# Patient Record
Sex: Male | Born: 1935 | Race: White | Hispanic: No | Marital: Married
Health system: Southern US, Community
[De-identification: ages and names within clinical notes are randomized; demographics above are authoritative.]

---

## 2018-06-05 ENCOUNTER — Emergency Department
Admission: EM | Admit: 2018-06-05 | Discharge: 2018-06-05 | Disposition: A | Discharge status: Skilled Nursing Facility | Payer: Medicare HMO | Attending: Emergency Medicine | Admitting: Emergency Medicine

## 2018-06-05 ENCOUNTER — Emergency Department: Payer: Medicare HMO

## 2018-06-05 ENCOUNTER — Other Ambulatory Visit: Payer: Self-pay

## 2018-06-05 DIAGNOSIS — Z23 Encounter for immunization: Secondary | ICD-10-CM | POA: Diagnosis not present

## 2018-06-05 DIAGNOSIS — Y92128 Other place in nursing home as the place of occurrence of the external cause: Secondary | ICD-10-CM | POA: Insufficient documentation

## 2018-06-05 DIAGNOSIS — W19XXXA Unspecified fall, initial encounter: Secondary | ICD-10-CM | POA: Diagnosis not present

## 2018-06-05 DIAGNOSIS — Y9389 Activity, other specified: Secondary | ICD-10-CM | POA: Insufficient documentation

## 2018-06-05 DIAGNOSIS — S0990XA Unspecified injury of head, initial encounter: Secondary | ICD-10-CM | POA: Diagnosis present

## 2018-06-05 DIAGNOSIS — S0001XA Abrasion of scalp, initial encounter: Secondary | ICD-10-CM | POA: Insufficient documentation

## 2018-06-05 DIAGNOSIS — T148XXA Other injury of unspecified body region, initial encounter: Secondary | ICD-10-CM

## 2018-06-05 DIAGNOSIS — Y999 Unspecified external cause status: Secondary | ICD-10-CM | POA: Insufficient documentation

## 2018-06-05 DIAGNOSIS — F0390 Unspecified dementia without behavioral disturbance: Secondary | ICD-10-CM | POA: Diagnosis not present

## 2018-06-05 LAB — URINALYSIS, COMPLETE (UACMP) WITH MICROSCOPIC
BILIRUBIN URINE: NEGATIVE
GLUCOSE, UA: NEGATIVE mg/dL
Ketones, ur: NEGATIVE mg/dL
LEUKOCYTES UA: NEGATIVE
NITRITE: NEGATIVE
PH: 6 (ref 5.0–8.0)
Protein, ur: NEGATIVE mg/dL
RBC / HPF: >50 RBC/hpf — ABNORMAL HIGH (ref 0–5)
SPECIFIC GRAVITY, URINE: 1.009 (ref 1.005–1.030)

## 2018-06-05 LAB — CBC WITH DIFFERENTIAL/PLATELET
BASOS ABS: 0 10*3/uL (ref 0–0.1)
BASOS PCT: 0 %
EOS ABS: 0 10*3/uL (ref 0–0.7)
EOS PCT: 0 %
HCT: 30.8 % — ABNORMAL LOW (ref 40.0–52.0)
HEMOGLOBIN: 10.5 g/dL — AB (ref 13.0–18.0)
LYMPHS ABS: 0.6 10*3/uL — AB (ref 1.0–3.6)
Lymphocytes Relative: 6 %
MCH: 32.4 pg (ref 26.0–34.0)
MCHC: 34.1 g/dL (ref 32.0–36.0)
MCV: 94.9 fL (ref 80.0–100.0)
Monocytes Absolute: 0.8 10*3/uL (ref 0.2–1.0)
Monocytes Relative: 9 %
NEUTROS PCT: 85 %
Neutro Abs: 7.6 10*3/uL — ABNORMAL HIGH (ref 1.4–6.5)
PLATELETS: 319 10*3/uL (ref 150–440)
RBC: 3.24 MIL/uL — AB (ref 4.40–5.90)
RDW: 13.2 % (ref 11.5–14.5)
WBC: 9 10*3/uL (ref 3.8–10.6)

## 2018-06-05 LAB — COMPREHENSIVE METABOLIC PANEL
ALT: 22 U/L (ref 0–44)
AST: 42 U/L — ABNORMAL HIGH (ref 15–41)
Albumin: 3 g/dL — ABNORMAL LOW (ref 3.5–5.0)
Alkaline Phosphatase: 71 U/L (ref 38–126)
Anion gap: 8 (ref 5–15)
BILIRUBIN TOTAL: 0.6 mg/dL (ref 0.3–1.2)
BUN: 19 mg/dL (ref 8–23)
CO2: 20 mmol/L — ABNORMAL LOW (ref 22–32)
Calcium: 8.3 mg/dL — ABNORMAL LOW (ref 8.9–10.3)
Chloride: 112 mmol/L — ABNORMAL HIGH (ref 98–111)
Creatinine, Ser: 1.11 mg/dL (ref 0.61–1.24)
GFR, EST NON AFRICAN AMERICAN: 60 mL/min — AB (ref >60)
Glucose, Bld: 102 mg/dL — ABNORMAL HIGH (ref 70–99)
Potassium: 3.8 mmol/L (ref 3.5–5.1)
Sodium: 140 mmol/L (ref 135–145)
TOTAL PROTEIN: 5.7 g/dL — AB (ref 6.5–8.1)

## 2018-06-05 LAB — GLUCOSE, CAPILLARY: GLUCOSE-CAPILLARY: 103 mg/dL — AB (ref 70–99)

## 2018-06-05 LAB — TROPONIN I: TROPONIN I: 0.03 ng/mL — AB (ref <0.03)

## 2018-06-05 MED ORDER — TETANUS-DIPHTH-ACELL PERTUSSIS 5-2.5-18.5 LF-MCG/0.5 IM SUSP
0.5000 mL | Freq: Once | INTRAMUSCULAR | Status: AC
  Administered 2018-06-05: 0.5 mL via INTRAMUSCULAR
  Filled 2018-06-05: qty 0.5

## 2018-06-05 NOTE — ED Notes (Addendum)
Pt is dressed and ready to go back to facility. Representative en route to pick patient up. Report already given to facility. See prior notes.  Pt unable to sign for discharge.

## 2018-06-05 NOTE — Discharge Instructions (Signed)
It was a pleasure to take care of you today, and thank you for coming to our emergency department.  If you have any questions or concerns before leaving please ask the nurse to grab me and I'm more than happy to go through your aftercare instructions again.  If you were prescribed any opioid pain medication today such as Norco, Vicodin, Percocet, morphine, hydrocodone, or oxycodone please make sure you do not drive when you are taking this medication as it can alter your ability to drive safely.  If you have any concerns once you are home that you are not improving or are in fact getting worse before you can make it to your follow-up appointment, please do not hesitate to call 911 and come back for further evaluation.  Merrily BrittleNeil Sherice Ijames, MD  Results for orders placed or performed during the hospital encounter of 06/05/18  Comprehensive metabolic panel  Result Value Ref Range   Sodium 140 135 - 145 mmol/L   Potassium 3.8 3.5 - 5.1 mmol/L   Chloride 112 (H) 98 - 111 mmol/L   CO2 20 (L) 22 - 32 mmol/L   Glucose, Bld 102 (H) 70 - 99 mg/dL   BUN 19 8 - 23 mg/dL   Creatinine, Ser 1.611.11 0.61 - 1.24 mg/dL   Calcium 8.3 (L) 8.9 - 10.3 mg/dL   Total Protein 5.7 (L) 6.5 - 8.1 g/dL   Albumin 3.0 (L) 3.5 - 5.0 g/dL   AST 42 (H) 15 - 41 U/L   ALT 22 0 - 44 U/L   Alkaline Phosphatase 71 38 - 126 U/L   Total Bilirubin 0.6 0.3 - 1.2 mg/dL   GFR calc non Af Amer 60 (L) >60 mL/min   GFR calc Af Amer >60 >60 mL/min   Anion gap 8 5 - 15  CBC with Differential  Result Value Ref Range   WBC 9.0 3.8 - 10.6 K/uL   RBC 3.24 (L) 4.40 - 5.90 MIL/uL   Hemoglobin 10.5 (L) 13.0 - 18.0 g/dL   HCT 09.630.8 (L) 04.540.0 - 40.952.0 %   MCV 94.9 80.0 - 100.0 fL   MCH 32.4 26.0 - 34.0 pg   MCHC 34.1 32.0 - 36.0 g/dL   RDW 81.113.2 91.411.5 - 78.214.5 %   Platelets 319 150 - 440 K/uL   Neutrophils Relative % 85 %   Neutro Abs 7.6 (H) 1.4 - 6.5 K/uL   Lymphocytes Relative 6 %   Lymphs Abs 0.6 (L) 1.0 - 3.6 K/uL   Monocytes Relative 9 %   Monocytes Absolute 0.8 0.2 - 1.0 K/uL   Eosinophils Relative 0 %   Eosinophils Absolute 0.0 0 - 0.7 K/uL   Basophils Relative 0 %   Basophils Absolute 0.0 0 - 0.1 K/uL  Troponin I  Result Value Ref Range   Troponin I 0.03 (HH) <0.03 ng/mL  Glucose, capillary  Result Value Ref Range   Glucose-Capillary 103 (H) 70 - 99 mg/dL   Dg Wrist Complete Left  Result Date: 06/05/2018 CLINICAL DATA:  Fall onto outstretched hand. Skin tear at the left wrist. Initial encounter. EXAM: LEFT WRIST - COMPLETE 3+ VIEW COMPARISON:  None. FINDINGS: There is no evidence of fracture or dislocation.  Osteopenia. IMPRESSION: No acute finding. Electronically Signed   By: Marnee SpringJonathon  Watts M.D.   On: 06/05/2018 08:44   Ct Head Wo Contrast  Result Date: 06/05/2018 CLINICAL DATA:  Pain following fall EXAM: CT HEAD WITHOUT CONTRAST CT CERVICAL SPINE WITHOUT CONTRAST TECHNIQUE: Multidetector CT imaging of  the head and cervical spine was performed following the standard protocol without intravenous contrast. Multiplanar CT image reconstructions of the cervical spine were also generated. COMPARISON:  Head CT Mar 25, 2018 FINDINGS: CT HEAD FINDINGS Brain: There is moderate diffuse atrophy. There is no intracranial mass, acute hemorrhage, extra-axial fluid collection, or midline shift. There is a prior infarct in the right frontal lobe with resolution of acute hemorrhage compared to prior study. Prior infarct in the right parietal lobe with resolving hemorrhage in this area. Elsewhere there is patchy small vessel disease in the centra semiovale bilaterally. Vascular: There is no appreciable hyperdense vessel. There is calcification in each distal vertebral artery and in the carotid siphon regions bilaterally. Skull: Bony calvarium appears intact. Sinuses/Orbits: There is mucosal thickening in multiple ethmoid air cells bilaterally. There is mucosal thickening in the posterior right maxillary antrum. There is mild mucosal thickening with  probable small retention cysts in each sphenoid sinus. There is mild mucosal thickening in the posterior inferior right frontal region. Orbits appear symmetric bilaterally. Patient has had cataract removals bilaterally. Other: Mastoid air cells are clear. CT CERVICAL SPINE FINDINGS Alignment: There is 2 mm of retrolisthesis of C3 on C4. No other spondylolisthesis evident. Skull base and vertebrae: Skull base and craniocervical junction regions appear normal. No fracture is evident. No blastic or lytic bone lesions are appreciable. Soft tissues and spinal canal: The prevertebral soft tissues and predental space regions are normal. There is no evident paraspinous lesion. There is no cord or canal hematoma evident. Disc levels: There is a moderate disc space narrowing at C5-6 and C6-7. There is multilevel facet osteoarthritic change. There is no frank disc extrusion or stenosis. Upper chest: There are scattered areas of bullae and scarring in the lung apices. An azygos lobe is noted on the right, an anatomic variant. Other: There is calcification in both carotid and vertebral arteries. IMPRESSION: CT head: Atrophy with periventricular small vessel disease. 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 No acute hemorrhage evident. No new intracranial lesion. Multiple foci of arterial vascular calcification. Multifocal paranasal sinus disease evident. CT cervical spine: No appreciable fracture. Slight spondylolisthesis at C3-4 is felt to be due to underlying spondylosis. Multilevel osteoarthritic change noted. Small bullae in the apices noted. Foci of carotid and vertebral artery calcification present bilaterally. Electronically Signed   By: Bretta Bang III M.D.   On: 06/05/2018 08:55   Ct Cervical Spine Wo Contrast  Result Date: 06/05/2018 CLINICAL DATA:  Pain following fall EXAM: CT HEAD WITHOUT CONTRAST CT CERVICAL SPINE WITHOUT CONTRAST TECHNIQUE: Multidetector  CT imaging of the head and cervical spine was performed following the standard protocol without intravenous contrast. Multiplanar CT image reconstructions of the cervical spine were also generated. COMPARISON:  Head CT Mar 25, 2018 FINDINGS: CT HEAD FINDINGS Brain: There is moderate diffuse atrophy. There is no intracranial mass, acute hemorrhage, extra-axial fluid collection, or midline shift. There is a prior infarct in the right frontal lobe with resolution of acute hemorrhage compared to prior study. Prior infarct in the right parietal lobe with resolving hemorrhage in this area. Elsewhere there is patchy small vessel disease in the centra semiovale bilaterally. Vascular: There is no appreciable hyperdense vessel. There is calcification in each distal vertebral artery and in the carotid siphon regions bilaterally. Skull: Bony calvarium appears intact. Sinuses/Orbits: There is mucosal thickening in multiple ethmoid air cells bilaterally. There is mucosal thickening in the posterior right maxillary antrum. There is mild  mucosal thickening with probable small retention cysts in each sphenoid sinus. There is mild mucosal thickening in the posterior inferior right frontal region. Orbits appear symmetric bilaterally. Patient has had cataract removals bilaterally. Other: Mastoid air cells are clear. CT CERVICAL SPINE FINDINGS Alignment: There is 2 mm of retrolisthesis of C3 on C4. No other spondylolisthesis evident. Skull base and vertebrae: Skull base and craniocervical junction regions appear normal. No fracture is evident. No blastic or lytic bone lesions are appreciable. Soft tissues and spinal canal: The prevertebral soft tissues and predental space regions are normal. There is no evident paraspinous lesion. There is no cord or canal hematoma evident. Disc levels: There is a moderate disc space narrowing at C5-6 and C6-7. There is multilevel facet osteoarthritic change. There is no frank disc extrusion or  stenosis. Upper chest: There are scattered areas of bullae and scarring in the lung apices. An azygos lobe is noted on the right, an anatomic variant. Other: There is calcification in both carotid and vertebral arteries. IMPRESSION: CT head: Atrophy with periventricular small vessel disease. 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 No acute hemorrhage evident. No new intracranial lesion. Multiple foci of arterial vascular calcification. Multifocal paranasal sinus disease evident. CT cervical spine: No appreciable fracture. Slight spondylolisthesis at C3-4 is felt to be due to underlying spondylosis. Multilevel osteoarthritic change noted. Small bullae in the apices noted. Foci of carotid and vertebral artery calcification present bilaterally. Electronically Signed   By: Bretta Bang III M.D.   On: 06/05/2018 08:55   Dg Chest Port 1 View  Result Date: 06/05/2018 CLINICAL DATA:  Syncope. EXAM: PORTABLE CHEST 1 VIEW COMPARISON:  None. FINDINGS: The heart size and mediastinal contours are within normal limits. Normal pulmonary vascularity. Biapical pleuroparenchymal scarring, greater on left. No focal consolidation, pleural effusion, or pneumothorax. No acute osseous abnormality. IMPRESSION: No active disease. Electronically Signed   By: Obie Dredge M.D.   On: 06/05/2018 08:47

## 2018-06-05 NOTE — ED Notes (Signed)
No urine noted in condom catheter/leg bag. Will continue to check. Pt resting comfortably at this time in NAD.

## 2018-06-05 NOTE — ED Notes (Signed)
Transport from facility is coming to pick patient up.

## 2018-06-05 NOTE — ED Triage Notes (Signed)
Pt arrives from [facility] dementia unit s/p unwitnessed fall from a chair. Staff was helping pt dress and stepped out of the room when patient fell out of chair. Skin tear to forehead and left wrist.

## 2018-06-05 NOTE — ED Notes (Signed)
Unsuccessful in & out catheterization. This RN attempted with Karle BarrKailey W, RN present. Condom cath applied to patient for urine specimen collection. MD aware.

## 2018-06-05 NOTE — ED Provider Notes (Signed)
[facility] Emergency Department Provider Note  ____________________________________________   First MD Initiated Contact with Patient 06/05/18 0710     (approximate)  I have reviewed the triage vital signs and the nursing notes.   HISTORY  Chief Complaint Fall  Level 5 exemption history limited by the patient's dementia  HPI Blake Robinson is a 84 y.o. male who comes to the emergency department via EMS after an unwitnessed fall at his memory care unit.  The patient has a past medical history of severe dementia and is unable to provide any history.  According to EMS the patient was being assisted getting up this morning and prepared for the day and his caretaker left the room for a moment and when he came back the patient was on the floor.  The patient himself can only say his name and does not know where he is, what year it is, or what happened.  He has no complaints.  EMS noted a skin tear to the top of his head.  They did not check a blood sugar.  Vital signs were unremarkable.  The patient himself has no complaints.    No past medical history on file.  There are no active problems to display for this patient.     Prior to Admission medications   Not on File    Allergies Patient has no allergy information on record.  No family history on file.  Social History Social History   Tobacco Use  . Smoking status: Not on file  Substance Use Topics  . Alcohol use: Not on file  . Drug use: Not on file    Review of Systems Level 5 exemption history limited by the patient's dementia ____________________________________________   PHYSICAL EXAM:  VITAL SIGNS: ED Triage Vitals  Enc Vitals Group     BP      Pulse      Resp      Temp      Temp src      SpO2      Weight      Height      Head Circumference      Peak Flow      Pain Score      Pain Loc      Pain Edu?      Excl. in GC?     Constitutional: Alert and oriented x1 to name only.   He does not know where he is what year it is who the president is or why he is here.  He is shaking although he says he is not cold.  Clearly quite confused Eyes: PERRL EOMI. midrange and brisk Head: 5 cm skin tear to vertex of his scalp. Nose: No congestion/rhinnorhea. Mouth/Throat: No trismus Neck: No stridor.  No midline tenderness or step-offs Cardiovascular: Normal rate, regular rhythm. Grossly normal heart sounds.  Good peripheral circulation. Respiratory: Normal respiratory effort.  No retractions. Lungs CTAB and moving good air Gastrointestinal: Soft nontender Musculoskeletal: No lower extremity edema.  Appears contracted and lying on his left side Abrasion to dorsal aspect of left wrist Neurologic: Moves all 4. Skin:  Skin is warm, dry and intact. No rash noted. Psychiatric: Severe dementia   ____________________________________________   DIFFERENTIAL includes but not limited to  Cardiogenic syncope, vasovagal syncope, mechanical fall, intracerebral hemorrhage, cervical spine fracture, urinary tract infection ____________________________________________   LABS (all labs ordered are listed, but only abnormal results are displayed)  Labs Reviewed  COMPREHENSIVE METABOLIC PANEL - Abnormal;  Notable for the following components:      Result Value   Chloride 112 (*)    CO2 20 (*)    Glucose, Bld 102 (*)    Calcium 8.3 (*)    Total Protein 5.7 (*)    Albumin 3.0 (*)    AST 42 (*)    GFR calc non Af Amer 60 (*)    All other components within normal limits  CBC WITH DIFFERENTIAL/PLATELET - Abnormal; Notable for the following components:   RBC 3.24 (*)    Hemoglobin 10.5 (*)    HCT 30.8 (*)    Neutro Abs 7.6 (*)    Lymphs Abs 0.6 (*)    All other components within normal limits  TROPONIN I - Abnormal; Notable for the following components:   Troponin I 0.03 (*)    All other components within normal limits  GLUCOSE, CAPILLARY - Abnormal; Notable for the following  components:   Glucose-Capillary 103 (*)    All other components within normal limits  URINALYSIS, COMPLETE (UACMP) WITH MICROSCOPIC - Abnormal; Notable for the following components:   Color, Urine YELLOW (*)    APPearance HAZY (*)    Hgb urine dipstick LARGE (*)    RBC / HPF >50 (*)    Bacteria, UA FEW (*)    All other components within normal limits    Lab work reviewed by me with hematuria although he had a traumatic Foley placement.  Otherwise unremarkable. __________________________________________  EKG  ED ECG REPORT I, Merrily Brittle, the attending physician, personally viewed and interpreted this ECG.  Date: 06/05/2018 EKG Time:  Rate: 66 Rhythm: normal sinus rhythm QRS Axis: Rightward axis Intervals: normal ST/T Wave abnormalities: normal Narrative Interpretation: no evidence of acute ischemia  ____________________________________________  RADIOLOGY  Head CT reviewed by me with no acute disease ____________________________________________   PROCEDURES  Procedure(s) performed: no  Procedures  Critical Care performed: no  ____________________________________________   INITIAL IMPRESSION / ASSESSMENT AND PLAN / ED COURSE  Pertinent labs & imaging results that were available during my care of the patient were reviewed by me and considered in my medical decision making (see chart for details).   As part of my medical decision making, I reviewed the following data within the electronic MEDICAL RECORD NUMBER History obtained from family if available, nursing notes, old chart and ekg, as well as notes from prior ED visits.  The patient comes to the emergency department profoundly demented with obvious head trauma.  Unclear if this represented a mechanical fall or syncopal event.  CT scan is fortunately reassuring and lab work with no clear etiology of the symptoms identified.  He was kept on monitor multiple hours with no ectopy.  Tetanus updated and wound washed out.   At this point the patient has no acute medical issues and he is medically stable for return to his facility with primary care follow-up.      ____________________________________________   FINAL CLINICAL IMPRESSION(S) / ED DIAGNOSES  Final diagnoses:  Avulsion of skin  Fall, initial encounter      NEW MEDICATIONS STARTED DURING THIS VISIT:  There are no discharge medications for this patient.    Note:  This document was prepared using Dragon voice recognition software and may include unintentional dictation errors.     Merrily Brittle, MD 06/06/18 1021

## 2018-06-06 ENCOUNTER — Emergency Department
Admission: EM | Admit: 2018-06-06 | Discharge: 2018-06-06 | Disposition: A | Discharge status: Home / Self Care | Payer: Medicare HMO | Attending: Emergency Medicine | Admitting: Emergency Medicine

## 2018-06-06 ENCOUNTER — Emergency Department: Payer: Medicare HMO

## 2018-06-06 ENCOUNTER — Encounter: Payer: Self-pay | Admitting: Emergency Medicine

## 2018-06-06 DIAGNOSIS — W19XXXA Unspecified fall, initial encounter: Secondary | ICD-10-CM | POA: Insufficient documentation

## 2018-06-06 DIAGNOSIS — F0390 Unspecified dementia without behavioral disturbance: Secondary | ICD-10-CM | POA: Diagnosis not present

## 2018-06-06 DIAGNOSIS — Z79899 Other long term (current) drug therapy: Secondary | ICD-10-CM | POA: Insufficient documentation

## 2018-06-06 DIAGNOSIS — R2242 Localized swelling, mass and lump, left lower limb: Secondary | ICD-10-CM | POA: Diagnosis present

## 2018-06-06 DIAGNOSIS — M7989 Other specified soft tissue disorders: Secondary | ICD-10-CM

## 2018-06-06 LAB — CBC WITH DIFFERENTIAL/PLATELET
BASOS ABS: 0 10*3/uL (ref 0–0.1)
BASOS PCT: 0 %
EOS ABS: 0 10*3/uL (ref 0–0.7)
Eosinophils Relative: 0 %
HCT: 29.7 % — ABNORMAL LOW (ref 40.0–52.0)
HEMOGLOBIN: 10.1 g/dL — AB (ref 13.0–18.0)
Lymphocytes Relative: 16 %
Lymphs Abs: 1.3 10*3/uL (ref 1.0–3.6)
MCH: 32.2 pg (ref 26.0–34.0)
MCHC: 34 g/dL (ref 32.0–36.0)
MCV: 94.8 fL (ref 80.0–100.0)
MONOS PCT: 10 %
Monocytes Absolute: 0.8 10*3/uL (ref 0.2–1.0)
NEUTROS PCT: 74 %
Neutro Abs: 6.1 10*3/uL (ref 1.4–6.5)
Platelets: 327 10*3/uL (ref 150–440)
RBC: 3.14 MIL/uL — ABNORMAL LOW (ref 4.40–5.90)
RDW: 13.6 % (ref 11.5–14.5)
WBC: 8.3 10*3/uL (ref 3.8–10.6)

## 2018-06-06 LAB — COMPREHENSIVE METABOLIC PANEL
ALBUMIN: 3.1 g/dL — AB (ref 3.5–5.0)
ALT: 26 U/L (ref 0–44)
ANION GAP: 7 (ref 5–15)
AST: 51 U/L — ABNORMAL HIGH (ref 15–41)
Alkaline Phosphatase: 67 U/L (ref 38–126)
BUN: 24 mg/dL — ABNORMAL HIGH (ref 8–23)
CALCIUM: 8.7 mg/dL — AB (ref 8.9–10.3)
CO2: 24 mmol/L (ref 22–32)
Chloride: 109 mmol/L (ref 98–111)
Creatinine, Ser: 1.22 mg/dL (ref 0.61–1.24)
GFR calc Af Amer: >60 mL/min (ref >60)
GFR calc non Af Amer: 53 mL/min — ABNORMAL LOW (ref >60)
GLUCOSE: 89 mg/dL (ref 70–99)
POTASSIUM: 5.4 mmol/L — AB (ref 3.5–5.1)
SODIUM: 140 mmol/L (ref 135–145)
Total Bilirubin: 0.9 mg/dL (ref 0.3–1.2)
Total Protein: 6.1 g/dL — ABNORMAL LOW (ref 6.5–8.1)

## 2018-06-06 LAB — TROPONIN I

## 2018-06-06 LAB — BRAIN NATRIURETIC PEPTIDE: B Natriuretic Peptide: 60 pg/mL (ref 0.0–100.0)

## 2018-06-06 MED ORDER — IOPAMIDOL (ISOVUE-370) INJECTION 76%
75.0000 mL | Freq: Once | INTRAVENOUS | Status: AC | PRN
  Administered 2018-06-06: 75 mL via INTRAVENOUS

## 2018-06-06 NOTE — Progress Notes (Signed)
Chaplain responded to a referred from Diplomatic Services operational officersecretary. Pt was not alert. Wife is tearful and emotional. Daughter is at bedside. Family is concerned they are making the right decision regarding putting him in care facility. Pt has fallen twice.  Chaplain prayed for revelation about their decision making and healing of the pt.    06/06/18 1000  Clinical Encounter Type  Visited With Patient and family together  Visit Type Initial;Psychological support  Referral From Nurse  Spiritual Encounters  Spiritual Needs Prayer

## 2018-06-06 NOTE — ED Provider Notes (Signed)
[facility] Emergency Department Provider Note  ____________________________________________   First MD Initiated Contact with Patient 06/06/18 1041     (approximate)  I have reviewed the triage vital signs and the nursing notes.   HISTORY  Chief Complaint Fall  Level 5 exemption history limited by the patient's dementia  HPI Blake Robinson is a 84 y.o. male who comes to the emergency department  after an unwitnessed fall at his nursing home.  I am familiar with the patient as he had an unwitnessed fall yesterday and I took care of in the emergency department.  At that point he had a head and neck CT which are unremarkable lab work and urinalysis were negative and he was discharged home.  Apparently family was unaware that he was in the emergency department at that point and once again he fell at his nursing home today which concerned him.  They have also noted that his left lower extremity has been swollen which worries them.   History reviewed. No pertinent past medical history.  There are no active problems to display for this patient.   History reviewed. No pertinent surgical history.  Prior to Admission medications   Medication Sig Start Date End Date Taking? Authorizing Provider  amLODipine (NORVASC) 5 MG tablet Take 5 mg by mouth daily. 04/08/18   [provider]  galantamine (RAZADYNE ER) 16 MG 24 hr capsule Take 16 mg by mouth daily. 04/01/18   [provider]  megestrol (MEGACE) 20 MG tablet Take 20 mg by mouth daily. 05/12/18   [provider]  memantine (NAMENDA) 10 MG tablet Take 10 mg by mouth 2 (two) times daily. 12/29/16 07/16/18  [provider]  pantoprazole (PROTONIX) 20 MG tablet Take 20 mg by mouth daily. 03/17/16   [provider]  QUEtiapine (SEROQUEL) 25 MG tablet Take 1 tablet (25 mg total) by mouth at bedtime. 06/07/18 07/07/18  Willy Eddy, MD  sertraline (ZOLOFT) 50 MG tablet Take 50 mg by  mouth daily. 12/29/16 02/22/19  [provider]  TOVIAZ 4 MG TB24 tablet Take 4 mg by mouth daily. 03/26/18   [provider]    Allergies Patient has no known allergies.  No family history on file.  Social History Social History   Tobacco Use  . Smoking status: Unknown If Ever Smoked  Substance Use Topics  . Alcohol use: Not on file  . Drug use: Not on file    Review of Systems Level 5 exemption history limited by the patient's dementia  ____________________________________________   PHYSICAL EXAM:  VITAL SIGNS: ED Triage Vitals  Enc Vitals Group     BP 06/06/18 0923 (!) 148/55     Pulse Rate 06/06/18 0923 81     Resp 06/06/18 0923 18     Temp 06/06/18 0923 (!) 97.2 F (36.2 C)     Temp Source 06/06/18 0923 Axillary     SpO2 06/06/18 0923 99 %     Weight 06/06/18 0924 139 lb 15.9 oz (63.5 kg)     Height 06/06/18 0924 5\' 4"  (1.626 m)     Head Circumference --      Peak Flow --      Pain Score --      Pain Loc --      Pain Edu? --      Excl. in GC? --     Constitutional: Profound dementia alert and oriented x1 to his name only Eyes: PERRL EOMI. midrange and brisk  Head: Resolving abrasion to left forehead. Nose: No congestion/rhinnorhea. Mouth/Throat: No trismus Neck: No stridor.  Midline tenderness or step-offs cardiovascular: Normal rate, regular rhythm. Grossly normal heart sounds.  Good peripheral circulation. Respiratory: Normal respiratory effort.  No retractions. Lungs CTAB and moving good air Gastrointestinal: Soft nontender Musculoskeletal: Left lower extremity slightly swollen Neurologic: Moves all 4 Skin:  Skin is warm, dry and intact. No rash noted. Psychiatric: Found dementia   ____________________________________________   DIFFERENTIAL includes but not limited to  DVT, pulmonary embolism, urinary tract infection, intracerebral hemorrhage ____________________________________________   LABS (all labs ordered are listed, but  only abnormal results are displayed)  Labs Reviewed  CBC WITH DIFFERENTIAL/PLATELET - Abnormal; Notable for the following components:      Result Value   RBC 3.14 (*)    Hemoglobin 10.1 (*)    HCT 29.7 (*)    All other components within normal limits  COMPREHENSIVE METABOLIC PANEL - Abnormal; Notable for the following components:   Potassium 5.4 (*)    BUN 24 (*)    Calcium 8.7 (*)    Total Protein 6.1 (*)    Albumin 3.1 (*)    AST 51 (*)    GFR calc non Af Amer 53 (*)    All other components within normal limits  TROPONIN I  BRAIN NATRIURETIC PEPTIDE    Lab work reviewed by me with no acute disease noted __________________________________________  EKG  ED ECG REPORT I, Merrily BrittleNeil Andreea Arca, the attending physician, personally viewed and interpreted this ECG.  Date: 06/08/2018 EKG Time:  Rate: 80 Rhythm: normal sinus rhythm QRS Axis: normal Intervals: normal ST/T Wave abnormalities: normal Narrative Interpretation: no evidence of acute ischemia  ____________________________________________  RADIOLOGY  CT head reviewed by me with no acute disease CT angiogram of the chest reviewed by me with no acute disease Ultrasound of the left lower extremity reviewed by me with no acute disease ____________________________________________   PROCEDURES  Procedure(s) performed: no  Procedures  Critical Care performed: no  ____________________________________________   INITIAL IMPRESSION / ASSESSMENT AND PLAN / ED COURSE  Pertinent labs & imaging results that were available during my care of the patient were reviewed by me and considered in my medical decision making (see chart for details).   As part of my medical decision making, I reviewed the following data within the electronic MEDICAL RECORD NUMBER History obtained from family if available, nursing notes, old chart and ekg, as well as notes from prior ED visits.  Patient arrives after another unwitnessed fall.  We will  keep him on monitor and scan his head again.  Given the left lower extremity swelling and possible syncope we will go ahead with a CT angiogram of his chest looking for PE.  CTs are negative so ultrasound of left lower extremity is pending.  Ultrasound is fortunately reassuring.  I do lengthy discussion with family regarding the diagnostic uncertainty but at this point there is no medical indication to admit him to the hospital.  They are comfortable having him go back to his previous facility.      ____________________________________________   FINAL CLINICAL IMPRESSION(S) / ED DIAGNOSES  Final diagnoses:  Fall, initial encounter  Leg swelling      NEW MEDICATIONS STARTED DURING THIS VISIT:  There are no discharge medications for this patient.    Note:  This document was prepared using Dragon voice recognition software and may include unintentional dictation errors.     Merrily Brittleifenbark, Gabrial Poppell, MD 06/08/18 [phone]

## 2018-06-06 NOTE — ED Notes (Signed)
Patient transported to CT 

## 2018-06-06 NOTE — Discharge Instructions (Signed)
It was a pleasure to take care of you today, and thank you for coming to our emergency department.  If you have any questions or concerns before leaving please ask the nurse to grab me and I'm more than happy to go through your aftercare instructions again.  If you were prescribed any opioid pain medication today such as Norco, Vicodin, Percocet, morphine, hydrocodone, or oxycodone please make sure you do not drive when you are taking this medication as it can alter your ability to drive safely.  If you have any concerns once you are home that you are not improving or are in fact getting worse before you can make it to your follow-up appointment, please do not hesitate to call 911 and come back for further evaluation.  Merrily Brittle, MD  Results for orders placed or performed during the hospital encounter of 06/06/18  Troponin I  Result Value Ref Range   Troponin I <0.03 <0.03 ng/mL  CBC with Differential  Result Value Ref Range   WBC 8.3 3.8 - 10.6 K/uL   RBC 3.14 (L) 4.40 - 5.90 MIL/uL   Hemoglobin 10.1 (L) 13.0 - 18.0 g/dL   HCT 16.1 (L) 09.6 - 04.5 %   MCV 94.8 80.0 - 100.0 fL   MCH 32.2 26.0 - 34.0 pg   MCHC 34.0 32.0 - 36.0 g/dL   RDW 40.9 81.1 - 91.4 %   Platelets 327 150 - 440 K/uL   Neutrophils Relative % 74 %   Neutro Abs 6.1 1.4 - 6.5 K/uL   Lymphocytes Relative 16 %   Lymphs Abs 1.3 1.0 - 3.6 K/uL   Monocytes Relative 10 %   Monocytes Absolute 0.8 0.2 - 1.0 K/uL   Eosinophils Relative 0 %   Eosinophils Absolute 0.0 0 - 0.7 K/uL   Basophils Relative 0 %   Basophils Absolute 0.0 0 - 0.1 K/uL  Comprehensive metabolic panel  Result Value Ref Range   Sodium 140 135 - 145 mmol/L   Potassium 5.4 (H) 3.5 - 5.1 mmol/L   Chloride 109 98 - 111 mmol/L   CO2 24 22 - 32 mmol/L   Glucose, Bld 89 70 - 99 mg/dL   BUN 24 (H) 8 - 23 mg/dL   Creatinine, Ser 7.82 0.61 - 1.24 mg/dL   Calcium 8.7 (L) 8.9 - 10.3 mg/dL   Total Protein 6.1 (L) 6.5 - 8.1 g/dL   Albumin 3.1 (L) 3.5 - 5.0  g/dL   AST 51 (H) 15 - 41 U/L   ALT 26 0 - 44 U/L   Alkaline Phosphatase 67 38 - 126 U/L   Total Bilirubin 0.9 0.3 - 1.2 mg/dL   GFR calc non Af Amer 53 (L) >60 mL/min   GFR calc Af Amer >60 >60 mL/min   Anion gap 7 5 - 15  Brain natriuretic peptide  Result Value Ref Range   B Natriuretic Peptide 60.0 0.0 - 100.0 pg/mL   Dg Wrist Complete Left  Result Date: 06/05/2018 CLINICAL DATA:  Fall onto outstretched hand. Skin tear at the left wrist. Initial encounter. EXAM: LEFT WRIST - COMPLETE 3+ VIEW COMPARISON:  None. FINDINGS: There is no evidence of fracture or dislocation.  Osteopenia. IMPRESSION: No acute finding. Electronically Signed   By: Marnee Spring M.D.   On: 06/05/2018 08:44   Ct Head Wo Contrast  Result Date: 06/06/2018 CLINICAL DATA:  Arrives from nursing facility after unwitnessed fall. Seen for same complaint yesterday. History of dementia. Hit back of head  today. EXAM: CT HEAD WITHOUT CONTRAST TECHNIQUE: Contiguous axial images were obtained from the base of the skull through the vertex without intravenous contrast. COMPARISON:  Head CTs dated 06/05/2018 and 03/25/2018. Brain MRI dated 05/15/2018. FINDINGS: Brain: Again noted is generalized age related volume loss with commensurate dilatation of the ventricles and sulci. Ventricles are stable in size and configuration. 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 There is no mass, hemorrhage, edema or other evidence of acute parenchymal abnormality. No extra-axial hemorrhage. Vascular: Chronic calcified atherosclerotic changes of the large vessels at the skull base. No unexpected hyperdense vessel. Skull: Normal. Negative for fracture or focal lesion. Sinuses/Orbits: No acute finding. Other: Focal mild soft tissue edema overlying the midline occipital bones. No underlying fracture. IMPRESSION: 1. Focal mild scalp edema overlying the midline occipital bones.  No underlying fracture. 2. No acute intracranial abnormality. No intracranial mass, hemorrhage or edema. 3. Chronic ischemic changes, as detailed above. Electronically Signed   By: Bary Richard M.D.   On: 06/06/2018 12:40   Ct Head Wo Contrast  Result Date: 06/05/2018 CLINICAL DATA:  Pain following fall EXAM: CT HEAD WITHOUT CONTRAST CT CERVICAL SPINE WITHOUT CONTRAST TECHNIQUE: Multidetector CT imaging of the head and cervical spine was performed following the standard protocol without intravenous contrast. Multiplanar CT image reconstructions of the cervical spine were also generated. COMPARISON:  Head CT Mar 25, 2018 FINDINGS: CT HEAD FINDINGS Brain: There is moderate diffuse atrophy. There is no intracranial mass, acute hemorrhage, extra-axial fluid collection, or midline shift. There is a prior infarct in the right frontal lobe with resolution of acute hemorrhage compared to prior study. Prior infarct in the right parietal lobe with resolving hemorrhage in this area. Elsewhere there is patchy small vessel disease in the centra semiovale bilaterally. Vascular: There is no appreciable hyperdense vessel. There is calcification in each distal vertebral artery and in the carotid siphon regions bilaterally. Skull: Bony calvarium appears intact. Sinuses/Orbits: There is mucosal thickening in multiple ethmoid air cells bilaterally. There is mucosal thickening in the posterior right maxillary antrum. There is mild mucosal thickening with probable small retention cysts in each sphenoid sinus. There is mild mucosal thickening in the posterior inferior right frontal region. Orbits appear symmetric bilaterally. Patient has had cataract removals bilaterally. Other: Mastoid air cells are clear. CT CERVICAL SPINE FINDINGS Alignment: There is 2 mm of retrolisthesis of C3 on C4. No other spondylolisthesis evident. Skull base and vertebrae: Skull base and craniocervical junction regions appear normal. No fracture is evident.  No blastic or lytic bone lesions are appreciable. Soft tissues and spinal canal: The prevertebral soft tissues and predental space regions are normal. There is no evident paraspinous lesion. There is no cord or canal hematoma evident. Disc levels: There is a moderate disc space narrowing at C5-6 and C6-7. There is multilevel facet osteoarthritic change. There is no frank disc extrusion or stenosis. Upper chest: There are scattered areas of bullae and scarring in the lung apices. An azygos lobe is noted on the right, an anatomic variant. Other: There is calcification in both carotid and vertebral arteries. IMPRESSION: CT head: Atrophy with periventricular small vessel disease. 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 No acute hemorrhage evident. No new intracranial lesion. Multiple foci of arterial vascular calcification. Multifocal paranasal sinus disease evident. CT cervical spine: No appreciable fracture. Slight spondylolisthesis at C3-4 is felt to be due to underlying  spondylosis. Multilevel osteoarthritic change noted. Small bullae in the apices noted. Foci of carotid and vertebral artery calcification present bilaterally. Electronically Signed   By: Bretta BangWilliam  Woodruff III M.D.   On: 06/05/2018 08:55   Ct Angio Chest Pe W/cm &/or Wo Cm  Result Date: 06/06/2018 CLINICAL DATA:  Leg swelling since this morning. Evaluate PE. PE suspected, high pretest probability. EXAM: CT ANGIOGRAPHY CHEST WITH CONTRAST TECHNIQUE: Multidetector CT imaging of the chest was performed using the standard protocol during bolus administration of intravenous contrast. Multiplanar CT image reconstructions and MIPs were obtained to evaluate the vascular anatomy. CONTRAST:  75mL ISOVUE-370 IOPAMIDOL (ISOVUE-370) INJECTION 76% COMPARISON:  None. FINDINGS: Cardiovascular: There is no pulmonary embolism identified within the main, lobar or segmental pulmonary arteries bilaterally. No  thoracic aortic aneurysm or evidence of aortic dissection. Scattered aortic atherosclerosis. Heart size is upper normal. No pericardial effusion. Coronary artery calcifications noted. Mediastinum/Nodes: Small hiatal hernia. No mass or enlarged lymph nodes seen within the mediastinum or perihilar regions. Trachea and central bronchi are unremarkable. Lungs/Pleura: Biapical pleuroparenchymal scarring/fibrosis. Associated emphysematous blebs at the lung apices. Lungs otherwise clear. No pneumonia or pulmonary edema. No pleural effusion or pneumothorax. No acute findings. Upper Abdomen: No acute findings. Musculoskeletal: Degenerative changes throughout the kyphotic thoracic spine, mild to moderate in degree. No acute or suspicious osseous finding. Review of the MIP images confirms the above findings. IMPRESSION: 1. No acute findings. No pulmonary embolism. No pneumonia or pulmonary edema. 2. Chronic/incidental findings detailed above Aortic Atherosclerosis (ICD10-I70.0) and Emphysema (ICD10-J43.9). Electronically Signed   By: Bary RichardStan  Maynard M.D.   On: 06/06/2018 12:46   Ct Cervical Spine Wo Contrast  Result Date: 06/05/2018 CLINICAL DATA:  Pain following fall EXAM: CT HEAD WITHOUT CONTRAST CT CERVICAL SPINE WITHOUT CONTRAST TECHNIQUE: Multidetector CT imaging of the head and cervical spine was performed following the standard protocol without intravenous contrast. Multiplanar CT image reconstructions of the cervical spine were also generated. COMPARISON:  Head CT Mar 25, 2018 FINDINGS: CT HEAD FINDINGS Brain: There is moderate diffuse atrophy. There is no intracranial mass, acute hemorrhage, extra-axial fluid collection, or midline shift. There is a prior infarct in the right frontal lobe with resolution of acute hemorrhage compared to prior study. Prior infarct in the right parietal lobe with resolving hemorrhage in this area. Elsewhere there is patchy small vessel disease in the centra semiovale bilaterally.  Vascular: There is no appreciable hyperdense vessel. There is calcification in each distal vertebral artery and in the carotid siphon regions bilaterally. Skull: Bony calvarium appears intact. Sinuses/Orbits: There is mucosal thickening in multiple ethmoid air cells bilaterally. There is mucosal thickening in the posterior right maxillary antrum. There is mild mucosal thickening with probable small retention cysts in each sphenoid sinus. There is mild mucosal thickening in the posterior inferior right frontal region. Orbits appear symmetric bilaterally. Patient has had cataract removals bilaterally. Other: Mastoid air cells are clear. CT CERVICAL SPINE FINDINGS Alignment: There is 2 mm of retrolisthesis of C3 on C4. No other spondylolisthesis evident. Skull base and vertebrae: Skull base and craniocervical junction regions appear normal. No fracture is evident. No blastic or lytic bone lesions are appreciable. Soft tissues and spinal canal: The prevertebral soft tissues and predental space regions are normal. There is no evident paraspinous lesion. There is no cord or canal hematoma evident. Disc levels: There is a moderate disc space narrowing at C5-6 and C6-7. There is multilevel facet osteoarthritic change. There is no frank disc extrusion or stenosis. Upper chest: There  are scattered areas of bullae and scarring in the lung apices. An azygos lobe is noted on the right, an anatomic variant. Other: There is calcification in both carotid and vertebral arteries. IMPRESSION: CT head: Atrophy with periventricular small vessel disease. 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 No acute hemorrhage evident. No new intracranial lesion. Multiple foci of arterial vascular calcification. Multifocal paranasal sinus disease evident. CT cervical spine: No appreciable fracture. Slight spondylolisthesis at C3-4 is felt to be due to underlying spondylosis. Multilevel  osteoarthritic change noted. Small bullae in the apices noted. Foci of carotid and vertebral artery calcification present bilaterally. Electronically Signed   By: Bretta Bang III M.D.   On: 06/05/2018 08:55   US Venous Img Lower Unilateral Left  Result Date: 06/06/2018 CLINICAL DATA:  84 year old male with a history of swelling EXAM: LEFT LOWER EXTREMITY VENOUS DOPPLER ULTRASOUND TECHNIQUE: Gray-scale sonography with graded compression, as well as color Doppler and duplex ultrasound were performed to evaluate the lower extremity deep venous systems from the level of the common femoral vein and including the common femoral, femoral, profunda femoral, popliteal and calf veins including the posterior tibial, peroneal and gastrocnemius veins when visible. The superficial great saphenous vein was also interrogated. Spectral Doppler was utilized to evaluate flow at rest and with distal augmentation maneuvers in the common femoral, femoral and popliteal veins. COMPARISON:  None. FINDINGS: Contralateral Common Femoral Vein: Respiratory phasicity is normal and symmetric with the symptomatic side. No evidence of thrombus. Normal compressibility. Common Femoral Vein: No evidence of thrombus. Normal compressibility, respiratory phasicity and response to augmentation. Saphenofemoral Junction: No evidence of thrombus. Normal compressibility and flow on color Doppler imaging. Profunda Femoral Vein: No evidence of thrombus. Normal compressibility and flow on color Doppler imaging. Femoral Vein: No evidence of thrombus. Normal compressibility, respiratory phasicity and response to augmentation. Popliteal Vein: No evidence of thrombus. Normal compressibility, respiratory phasicity and response to augmentation. Calf Veins: No evidence of thrombus. Normal compressibility and flow on color Doppler imaging. Superficial Great Saphenous Vein: No evidence of thrombus. Normal compressibility and flow on color Doppler imaging. Other  Findings:  None. IMPRESSION: Sonographic survey of the left lower extremity negative for DVT Electronically Signed   By: Gilmer Mor D.O.   On: 06/06/2018 14:48   Dg Chest Port 1 View  Result Date: 06/05/2018 CLINICAL DATA:  Syncope. EXAM: PORTABLE CHEST 1 VIEW COMPARISON:  None. FINDINGS: The heart size and mediastinal contours are within normal limits. Normal pulmonary vascularity. Biapical pleuroparenchymal scarring, greater on left. No focal consolidation, pleural effusion, or pneumothorax. No acute osseous abnormality. IMPRESSION: No active disease. Electronically Signed   By: Obie Dredge M.D.   On: 06/05/2018 08:47   Dg Foot Complete Left  Result Date: 06/06/2018 CLINICAL DATA:  84 year old male with a history fall and foot pain EXAM: LEFT FOOT - COMPLETE 3+ VIEW COMPARISON:  None. FINDINGS: No acute displaced fracture. Mild degenerative changes of the interphalangeal joints. Mild degenerative changes of the hindfoot and midfoot. Calcifications of the vasculature. Soft tissue swelling on the dorsum of the forefoot on the lateral view. No radiopaque foreign body. IMPRESSION: Negative for acute bony abnormality. Soft tissue swelling on the dorsum of the forefoot on the lateral view. Tibial calcifications Electronically Signed   By: Gilmer Mor D.O.   On: 06/06/2018 10:58

## 2018-06-06 NOTE — ED Notes (Signed)
Pt returned from CT at this time.  

## 2018-06-06 NOTE — ED Triage Notes (Signed)
Pt arrived from nursing facility after unwitness fall. Pt seen for same complaint yesterday. Pt poor historian, with hx of dementia.

## 2018-06-07 ENCOUNTER — Other Ambulatory Visit: Payer: Self-pay

## 2018-06-07 ENCOUNTER — Emergency Department
Admission: EM | Admit: 2018-06-07 | Discharge: 2018-06-07 | Disposition: A | Discharge status: Home / Self Care | Payer: Medicare HMO | Attending: Student in an Organized Health Care Education/Training Program | Admitting: Student in an Organized Health Care Education/Training Program

## 2018-06-07 ENCOUNTER — Emergency Department: Payer: Medicare HMO

## 2018-06-07 DIAGNOSIS — F0390 Unspecified dementia without behavioral disturbance: Secondary | ICD-10-CM | POA: Diagnosis not present

## 2018-06-07 DIAGNOSIS — Z79899 Other long term (current) drug therapy: Secondary | ICD-10-CM | POA: Insufficient documentation

## 2018-06-07 DIAGNOSIS — R410 Disorientation, unspecified: Secondary | ICD-10-CM | POA: Diagnosis not present

## 2018-06-07 DIAGNOSIS — R4182 Altered mental status, unspecified: Secondary | ICD-10-CM | POA: Diagnosis present

## 2018-06-07 LAB — URINALYSIS, COMPLETE (UACMP) WITH MICROSCOPIC
Bacteria, UA: NONE SEEN
Bilirubin Urine: NEGATIVE
GLUCOSE, UA: NEGATIVE mg/dL
Ketones, ur: NEGATIVE mg/dL
Leukocytes, UA: NEGATIVE
Nitrite: NEGATIVE
PH: 8 (ref 5.0–8.0)
Protein, ur: 30 mg/dL — AB
RBC / HPF: >50 RBC/hpf — ABNORMAL HIGH (ref 0–5)
Specific Gravity, Urine: 1.014 (ref 1.005–1.030)
Squamous Epithelial / LPF: NONE SEEN (ref 0–5)

## 2018-06-07 LAB — COMPREHENSIVE METABOLIC PANEL
ALBUMIN: 2.9 g/dL — AB (ref 3.5–5.0)
ALK PHOS: 72 U/L (ref 38–126)
ALT: 26 U/L (ref 0–44)
AST: 41 U/L (ref 15–41)
Anion gap: 6 (ref 5–15)
BILIRUBIN TOTAL: 0.5 mg/dL (ref 0.3–1.2)
BUN: 17 mg/dL (ref 8–23)
CALCIUM: 8.5 mg/dL — AB (ref 8.9–10.3)
CO2: 24 mmol/L (ref 22–32)
Chloride: 109 mmol/L (ref 98–111)
Creatinine, Ser: 1.04 mg/dL (ref 0.61–1.24)
GFR calc Af Amer: >60 mL/min (ref >60)
GLUCOSE: 110 mg/dL — AB (ref 70–99)
Potassium: 3.8 mmol/L (ref 3.5–5.1)
Sodium: 139 mmol/L (ref 135–145)
TOTAL PROTEIN: 5.9 g/dL — AB (ref 6.5–8.1)

## 2018-06-07 LAB — CBC WITH DIFFERENTIAL/PLATELET
BASOS PCT: 0 %
Basophils Absolute: 0 10*3/uL (ref 0–0.1)
Eosinophils Absolute: 0 10*3/uL (ref 0–0.7)
Eosinophils Relative: 0 %
HEMATOCRIT: 29.7 % — AB (ref 40.0–52.0)
HEMOGLOBIN: 10.2 g/dL — AB (ref 13.0–18.0)
LYMPHS ABS: 1.1 10*3/uL (ref 1.0–3.6)
LYMPHS PCT: 12 %
MCH: 32.4 pg (ref 26.0–34.0)
MCHC: 34.5 g/dL (ref 32.0–36.0)
MCV: 93.9 fL (ref 80.0–100.0)
MONO ABS: 1.1 10*3/uL — AB (ref 0.2–1.0)
Monocytes Relative: 12 %
NEUTROS ABS: 7.1 10*3/uL — AB (ref 1.4–6.5)
NEUTROS PCT: 76 %
Platelets: 323 10*3/uL (ref 150–440)
RBC: 3.16 MIL/uL — ABNORMAL LOW (ref 4.40–5.90)
RDW: 13.3 % (ref 11.5–14.5)
WBC: 9.4 10*3/uL (ref 3.8–10.6)

## 2018-06-07 LAB — TROPONIN I: Troponin I: <0.03 ng/mL (ref <0.03)

## 2018-06-07 MED ORDER — QUETIAPINE FUMARATE 25 MG PO TABS
25.0000 mg | ORAL_TABLET | Freq: Every day | ORAL | Status: DC
  Administered 2018-06-07: 25 mg via ORAL
  Filled 2018-06-07: qty 1

## 2018-06-07 MED ORDER — QUETIAPINE FUMARATE 25 MG PO TABS: 25.0000 mg | ORAL_TABLET | Freq: Every day | ORAL | 0 refills | Status: AC

## 2018-06-07 MED ORDER — SODIUM CHLORIDE 0.9 % IV BOLUS
500.0000 mL | Freq: Once | INTRAVENOUS | Status: AC
  Administered 2018-06-07: 500 mL via INTRAVENOUS

## 2018-06-07 NOTE — ED Triage Notes (Signed)
[facility] EMS reports family is concerned that pt is "a little more confused than nomal." Pt poor historian, hx of dementia, oriented to his name only, which is baseline.

## 2018-06-07 NOTE — ED Notes (Signed)
Unsuccessful attempt x 2 for in and out cath. Coude cath futile. Bladder scan 63ml. Wee bag placed and fluids initiated.

## 2018-06-07 NOTE — ED Notes (Signed)
Pt provided crackers, peanut butter, and [place] sandwich.

## 2018-06-07 NOTE — ED Provider Notes (Signed)
[facility] Emergency Department Provider Note    First MD Initiated Contact with Patient 06/07/18 1100     (approximate)  I have reviewed the triage vital signs and the nursing notes.   HISTORY  Chief Complaint Altered Mental Status  Level V Caveat:  dementia  HPI Blake Robinson is a 84 y.o. male presents the ER for the third time this week after recently been moved to [facility] [place].  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  No medication changes.  Patient with advanced dementia unable to provide much additional history but he appears comfortable.  Denies any pain.    PMH:  dementia No family history on file. No past surgical history on file. There are no active problems to display for this patient.     Prior to Admission medications   Medication Sig Start Date End Date Taking? Authorizing Provider  amLODipine (NORVASC) 5 MG tablet Take 5 mg by mouth daily. 04/08/18  Yes [provider]  galantamine (RAZADYNE ER) 16 MG 24 hr capsule Take 16 mg by mouth daily. 04/01/18  Yes [provider]  megestrol (MEGACE) 20 MG tablet Take 20 mg by mouth daily. 05/12/18  Yes [provider]  memantine (NAMENDA) 10 MG tablet Take 10 mg by mouth 2 (two) times daily. 12/29/16 07/16/18 Yes [provider]  pantoprazole (PROTONIX) 20 MG tablet Take 20 mg by mouth daily. 03/17/16  Yes [provider]  sertraline (ZOLOFT) 50 MG tablet Take 50 mg by mouth daily. 12/29/16 02/22/19 Yes [provider]  TOVIAZ 4 MG TB24 tablet Take 4 mg by mouth daily. 03/26/18  Yes [provider]  QUEtiapine (SEROQUEL) 25 MG tablet Take 1 tablet (25 mg total) by mouth at bedtime. 06/07/18 07/07/18  Willy Eddyobinson, Bannie Lobban, MD    Allergies Patient has no known  allergies.    Social History Social History   Tobacco Use  . Smoking status: Unknown If Ever Smoked  Substance Use Topics  . Alcohol use: Not on file  . Drug use: Not on file    Review of Systems Patient denies headaches, rhinorrhea, blurry vision, numbness, shortness of breath, chest pain, edema, cough, abdominal pain, nausea, vomiting, diarrhea, dysuria, fevers, rashes or hallucinations unless otherwise stated above in HPI. ____________________________________________   PHYSICAL EXAM:  VITAL SIGNS: Vitals:   06/07/18 1244 06/07/18 1258  BP:    Pulse: 100 96  Resp: (!) 27 19  Temp:    SpO2: 100% 100%    Constitutional: Alert and oriented only to person Eyes: Conjunctivae are normal.  Head: Atraumatic. Nose: No congestion/rhinnorhea. Mouth/Throat: Mucous membranes are moist.   Neck: No stridor. Painless ROM.  Cardiovascular: Normal rate, regular rhythm. Grossly normal heart sounds.  Good peripheral circulation. Respiratory: Normal respiratory effort.  No retractions. Lungs CTAB. Gastrointestinal: Soft and nontender. No distention. No abdominal bruits. No CVA tenderness. Genitourinary:  Musculoskeletal: No lower extremity tenderness nor edema.  No joint effusions. Neurologic:  Normal speech No gross focal neurologic deficits are appreciated. No facial droop appreciated Skin:  Skin is warm, dry and intact. No rash noted.  ____________________________________________   LABS (all labs ordered are listed, but only abnormal results are displayed)  Results for orders placed or performed during the hospital encounter of 06/07/18 (from the past 24 hour(s))  CBC with  Differential/Platelet     Status: Abnormal   Collection Time: 06/07/18 11:57 AM  Result Value Ref Range   WBC 9.4 3.8 - 10.6 K/uL   RBC 3.16 (L) 4.40 - 5.90 MIL/uL   Hemoglobin 10.2 (L) 13.0 - 18.0 g/dL   HCT 40.9 (L) 81.1 - 91.4 %   MCV 93.9 80.0 - 100.0 fL   MCH 32.4 26.0 - 34.0 pg   MCHC 34.5 32.0 -  36.0 g/dL   RDW 78.2 95.6 - 21.3 %   Platelets 323 150 - 440 K/uL   Neutrophils Relative % 76 %   Neutro Abs 7.1 (H) 1.4 - 6.5 K/uL   Lymphocytes Relative 12 %   Lymphs Abs 1.1 1.0 - 3.6 K/uL   Monocytes Relative 12 %   Monocytes Absolute 1.1 (H) 0.2 - 1.0 K/uL   Eosinophils Relative 0 %   Eosinophils Absolute 0.0 0 - 0.7 K/uL   Basophils Relative 0 %   Basophils Absolute 0.0 0 - 0.1 K/uL  Comprehensive metabolic panel     Status: Abnormal   Collection Time: 06/07/18 11:57 AM  Result Value Ref Range   Sodium 139 135 - 145 mmol/L   Potassium 3.8 3.5 - 5.1 mmol/L   Chloride 109 98 - 111 mmol/L   CO2 24 22 - 32 mmol/L   Glucose, Bld 110 (H) 70 - 99 mg/dL   BUN 17 8 - 23 mg/dL   Creatinine, Ser 0.86 0.61 - 1.24 mg/dL   Calcium 8.5 (L) 8.9 - 10.3 mg/dL   Total Protein 5.9 (L) 6.5 - 8.1 g/dL   Albumin 2.9 (L) 3.5 - 5.0 g/dL   AST 41 15 - 41 U/L   ALT 26 0 - 44 U/L   Alkaline Phosphatase 72 38 - 126 U/L   Total Bilirubin 0.5 0.3 - 1.2 mg/dL   GFR calc non Af Amer >60 >60 mL/min   GFR calc Af Amer >60 >60 mL/min   Anion gap 6 5 - 15  Troponin I     Status: None   Collection Time: 06/07/18 12:47 PM  Result Value Ref Range   Troponin I <0.03 <0.03 ng/mL  Urinalysis, Complete w Microscopic     Status: Abnormal   Collection Time: 06/07/18  2:12 PM  Result Value Ref Range   Color, Urine YELLOW (A) YELLOW   APPearance HAZY (A) CLEAR   Specific Gravity, Urine 1.014 1.005 - 1.030   pH 8.0 5.0 - 8.0   Glucose, UA NEGATIVE NEGATIVE mg/dL   Hgb urine dipstick LARGE (A) NEGATIVE   Bilirubin Urine NEGATIVE NEGATIVE   Ketones, ur NEGATIVE NEGATIVE mg/dL   Protein, ur 30 (A) NEGATIVE mg/dL   Nitrite NEGATIVE NEGATIVE   Leukocytes, UA NEGATIVE NEGATIVE   RBC / HPF >50 (H) 0 - 5 RBC/hpf   WBC, UA 6-10 0 - 5 WBC/hpf   Bacteria, UA NONE SEEN NONE SEEN   Squamous Epithelial / LPF NONE SEEN 0 - 5   ____________________________________________  EKG My review and personal interpretation  at Time: 11:54   Indication: ams  Rate: 75  Rhythm: sinus Axis: normal Other: baseline artifact, no stemi ____________________________________________  RADIOLOGY  I personally reviewed all radiographic images ordered to evaluate for the above acute complaints and reviewed radiology reports and findings.  These findings were personally discussed with the patient.  Please see medical record for radiology report.  ____________________________________________   PROCEDURES  Procedure(s) performed:  Procedures    Critical Care performed: no ____________________________________________  INITIAL IMPRESSION / ASSESSMENT AND PLAN / ED COURSE  Pertinent labs & imaging results that were available during my care of the patient were reviewed by me and considered in my medical decision making (see chart for details).   DDX: Dehydration, sepsis, pna, uti, hypoglycemia, cva, drug effect, withdrawal,    Blake Robinson is a 84 y.o. who presents to the ED with symptoms as described above.  Patient is AFVSS in ED. Exam as above. Given current presentation have considered the above differential.  Order CT and blood work as described above.  The patient will be placed on continuous pulse oximetry and telemetry for monitoring.  Laboratory evaluation will be sent to evaluate for the above complaints.      Clinical Course as of Jun 07 2010  Fri Jun 07, 2018  1303 Discussed results of CT imaging with family.  Previous hemorrhage is resolving.  Does not seem clinically consistent with new stroke.  Will obtain urinalysis and give fluids.  Discussed my concern with family that this does appear to be worsening dementia.   [PR]  1520 Urinalysis does not show any evidence of infection.  Do suspect presentation secondary to worsening underlying dementia.  Discussed results and presentation with family at bedside.  Feel comfortable taking patient back to memory care facility.  Have requested something to help with  agitation that he is having frequently in the evening.  Patient tolerated Seroquel here in the ER.  Will provide prescription for Seroquel.  Have discussed with the patient and available family all diagnostics and treatments performed thus far and all questions were answered to the best of my ability. The patient demonstrates understanding and agreement with plan.    [PR]    Clinical Course User Index [PR] Willy Eddy, MD     As part of my medical decision making, I reviewed the following data within the electronic MEDICAL RECORD NUMBER Nursing notes reviewed and incorporated, Labs reviewed, notes from prior ED visits.   ____________________________________________   FINAL CLINICAL IMPRESSION(S) / ED DIAGNOSES  Final diagnoses:  Confusion  Dementia without behavioral disturbance, unspecified dementia type      NEW MEDICATIONS STARTED DURING THIS VISIT:  Discharge Medication List as of 06/07/2018  3:19 PM    START taking these medications   Details  QUEtiapine (SEROQUEL) 25 MG tablet Take 1 tablet (25 mg total) by mouth at bedtime., Starting Fri 06/07/2018, Until Sun 07/07/2018, Normal         Note:  This document was prepared using Dragon voice recognition software and may include unintentional dictation errors.    Willy Eddy, MD 06/07/18 2011

## 2018-06-08 LAB — URINE CULTURE: Culture: <10000 — AB

## 2018-06-09 ENCOUNTER — Emergency Department: Payer: Medicare HMO

## 2018-06-09 ENCOUNTER — Emergency Department
Admission: EM | Admit: 2018-06-09 | Discharge: 2018-06-09 | Disposition: A | Discharge status: Home / Self Care | Payer: Medicare HMO | Attending: Emergency Medicine | Admitting: Emergency Medicine

## 2018-06-09 DIAGNOSIS — Y999 Unspecified external cause status: Secondary | ICD-10-CM | POA: Insufficient documentation

## 2018-06-09 DIAGNOSIS — T148XXA Other injury of unspecified body region, initial encounter: Secondary | ICD-10-CM | POA: Diagnosis not present

## 2018-06-09 DIAGNOSIS — F0390 Unspecified dementia without behavioral disturbance: Secondary | ICD-10-CM | POA: Insufficient documentation

## 2018-06-09 DIAGNOSIS — Y939 Activity, unspecified: Secondary | ICD-10-CM | POA: Diagnosis not present

## 2018-06-09 DIAGNOSIS — L089 Local infection of the skin and subcutaneous tissue, unspecified: Secondary | ICD-10-CM | POA: Insufficient documentation

## 2018-06-09 DIAGNOSIS — Y92129 Unspecified place in nursing home as the place of occurrence of the external cause: Secondary | ICD-10-CM | POA: Diagnosis not present

## 2018-06-09 DIAGNOSIS — W19XXXA Unspecified fall, initial encounter: Secondary | ICD-10-CM | POA: Insufficient documentation

## 2018-06-09 DIAGNOSIS — Z79899 Other long term (current) drug therapy: Secondary | ICD-10-CM | POA: Insufficient documentation

## 2018-06-09 LAB — CBC WITH DIFFERENTIAL/PLATELET
Basophils Absolute: 0 10*3/uL (ref 0–0.1)
Basophils Relative: 0 %
EOS ABS: 0 10*3/uL (ref 0–0.7)
Eosinophils Relative: 0 %
HCT: 28.5 % — ABNORMAL LOW (ref 40.0–52.0)
HEMOGLOBIN: 10.1 g/dL — AB (ref 13.0–18.0)
LYMPHS PCT: 13 %
Lymphs Abs: 1.2 10*3/uL (ref 1.0–3.6)
MCH: 33 pg (ref 26.0–34.0)
MCHC: 35.3 g/dL (ref 32.0–36.0)
MCV: 93.6 fL (ref 80.0–100.0)
Monocytes Absolute: 0.9 10*3/uL (ref 0.2–1.0)
Monocytes Relative: 10 %
NEUTROS PCT: 77 %
Neutro Abs: 6.8 10*3/uL — ABNORMAL HIGH (ref 1.4–6.5)
Platelets: 348 10*3/uL (ref 150–440)
RBC: 3.05 MIL/uL — AB (ref 4.40–5.90)
RDW: 13.3 % (ref 11.5–14.5)
WBC: 8.9 10*3/uL (ref 3.8–10.6)

## 2018-06-09 LAB — COMPREHENSIVE METABOLIC PANEL
ALT: 28 U/L (ref 0–44)
ANION GAP: 10 (ref 5–15)
AST: 32 U/L (ref 15–41)
Albumin: 3 g/dL — ABNORMAL LOW (ref 3.5–5.0)
Alkaline Phosphatase: 73 U/L (ref 38–126)
BUN: 27 mg/dL — ABNORMAL HIGH (ref 8–23)
CALCIUM: 8.6 mg/dL — AB (ref 8.9–10.3)
CHLORIDE: 108 mmol/L (ref 98–111)
CO2: 21 mmol/L — AB (ref 22–32)
Creatinine, Ser: 1.03 mg/dL (ref 0.61–1.24)
GFR calc Af Amer: >60 mL/min (ref >60)
GFR calc non Af Amer: >60 mL/min (ref >60)
Glucose, Bld: 104 mg/dL — ABNORMAL HIGH (ref 70–99)
POTASSIUM: 3.8 mmol/L (ref 3.5–5.1)
SODIUM: 139 mmol/L (ref 135–145)
Total Bilirubin: 0.7 mg/dL (ref 0.3–1.2)
Total Protein: 6.3 g/dL — ABNORMAL LOW (ref 6.5–8.1)

## 2018-06-09 LAB — TROPONIN I: Troponin I: 0.03 ng/mL (ref <0.03)

## 2018-06-09 LAB — LACTIC ACID, PLASMA: Lactic Acid, Venous: 0.8 mmol/L (ref 0.5–1.9)

## 2018-06-09 MED ORDER — CEPHALEXIN 500 MG PO CAPS: 500.0000 mg | ORAL_CAPSULE | Freq: Two times a day (BID) | ORAL | 0 refills | Status: AC

## 2018-06-09 NOTE — ED Notes (Signed)
Report to lorrie, rn.  

## 2018-06-09 NOTE — ED Provider Notes (Signed)
[facility] Emergency Department Provider Note   ____________________________________________    I have reviewed the triage vital signs and the nursing notes.   HISTORY  Chief Complaint Fall  Patient with a history of severe dementia unable to provide history   HPI Carilyn Goodpastureommy Millay is a 84 y.o. male who presents from [facility] for evaluation for frequent falls.  Review of medical records demonstrates the patient is been seen on August 7 eighth ninth and now today.  Over the last several days has had CT Angio of the chest which was negative for PE as well as multiple CTs of the head.  Lab work performed on the ninth overall unremarkable.  Nurse tells me that the patient had multiple bandages which had not been changed  No past medical history on file.  There are no active problems to display for this patient.   No past surgical history on file.  Prior to Admission medications   Medication Sig Start Date End Date Taking? Authorizing Provider  amLODipine (NORVASC) 5 MG tablet Take 5 mg by mouth daily. 04/08/18  Yes [provider]  galantamine (RAZADYNE ER) 16 MG 24 hr capsule Take 16 mg by mouth daily. 04/01/18  Yes [provider]  megestrol (MEGACE) 20 MG tablet Take 20 mg by mouth daily. 05/12/18  Yes [provider]  memantine (NAMENDA) 10 MG tablet Take 10 mg by mouth 2 (two) times daily. 12/29/16 07/16/18 Yes [provider]  pantoprazole (PROTONIX) 20 MG tablet Take 20 mg by mouth daily. 03/17/16  Yes [provider]  QUEtiapine (SEROQUEL) 25 MG tablet Take 1 tablet (25 mg total) by mouth at bedtime. 06/07/18 07/07/18 Yes Willy Eddyobinson, Patrick, MD  sertraline (ZOLOFT) 50 MG tablet Take 50 mg by mouth daily. 12/29/16 02/22/19 Yes [provider]  TOVIAZ 4 MG TB24 tablet Take 4 mg by mouth daily. 03/26/18  Yes [provider]  cephALEXin (KEFLEX) 500 MG capsule Take 1 capsule (500 mg total) by mouth  2 (two) times daily. 06/09/18   Jene EveryKinner, Romi Rathel, MD     Allergies Patient has no known allergies.  No family history on file.  Social History Social History   Tobacco Use  . Smoking status: Unknown If Ever Smoked  Substance Use Topics  . Alcohol use: Not on file  . Drug use: Not on file    Level 5 caveat: Unable to obtain review of Systems due to dementia     ____________________________________________   PHYSICAL EXAM:  VITAL SIGNS: ED Triage Vitals  Enc Vitals Group     BP 06/09/18 0645 (!) 156/74     Pulse Rate 06/09/18 0645 (!) 107     Resp 06/09/18 0645 (!) 22     Temp 06/09/18 0702 99 F (37.2 C)     Temp Source 06/09/18 0702 Axillary     SpO2 --      Weight 06/09/18 0647 59.9 kg (132 lb)     Height 06/09/18 0647 1.626 m (5\' 4" )     Head Circumference --      Peak Flow --      Pain Score --      Pain Loc --      Pain Edu? --      Excl. in GC? --     Constitutional: Alert. No acute distress. Eyes: Conjunctivae are normal.  Head: Old abrasion to the forehead with possibly purulent discharge although no significant surrounding erythema to suggest cellulitis, no  fluctuance Nose: No epistaxis or swelling Mouth/Throat: Mucous membranes are moist.   Neck: No vertebral tenderness to palpation Cardiovascular: Normal rate, regular rhythm. Grossly normal heart sounds.  Good peripheral circulation. Respiratory: Normal respiratory effort.  No retractions. Lungs CTAB.  Gastrointestinal: Soft and nontender. No distention.  Genitourinary: No erythema or crepitus Musculoskeletal: Abrasion to the lateral lower left leg with mild surrounding erythema, no discharge, no crepitus warm and well perfused Neurologic: Moves all extremities equally, cranial nerves appear intact Skin:  Skin is warm, dry, see above   ____________________________________________   LABS (all labs ordered are listed, but only abnormal results are displayed)  Labs Reviewed  COMPREHENSIVE  METABOLIC PANEL - Abnormal; Notable for the following components:      Result Value   CO2 21 (*)    Glucose, Bld 104 (*)    BUN 27 (*)    Calcium 8.6 (*)    Total Protein 6.3 (*)    Albumin 3.0 (*)    All other components within normal limits  TROPONIN I - Abnormal; Notable for the following components:   Troponin I 0.03 (*)    All other components within normal limits  CBC WITH DIFFERENTIAL/PLATELET - Abnormal; Notable for the following components:   RBC 3.05 (*)    Hemoglobin 10.1 (*)    HCT 28.5 (*)    Neutro Abs 6.8 (*)    All other components within normal limits  LACTIC ACID, PLASMA  URINALYSIS, COMPLETE (UACMP) WITH MICROSCOPIC  LACTIC ACID, PLASMA   ____________________________________________  EKG  ED ECG REPORT I, Jene Every, the attending physician, personally viewed and interpreted this ECG.  Date: 06/09/2018  Rhythm: normal sinus rhythm QRS Axis: normal Intervals: Right anterior fascicular block, left anterior fascicular block ST/T Wave abnormalities: Nonspecific changes   ____________________________________________  RADIOLOGY  CT head cervical spine unremarkable, chest x-ray normal ____________________________________________   PROCEDURES  Procedure(s) performed: No  Procedures   Critical Care performed: No ____________________________________________   INITIAL IMPRESSION / ASSESSMENT AND PLAN / ED COURSE  Pertinent labs & imaging results that were available during my care of the patient were reviewed by me and considered in my medical decision making (see chart for details).  Patient presents with reports of frequent falls, seen multiple times over the last several days.  Some concern about infected abrasions to the forehead and left lower leg, patient with mild tachycardia temperature 99.0  Daughter is here, discussed with her at length.  She and wife would like to discuss with social work and possible hospice  Social work has  spoken with the patient's, they would like to go back to [facility] and then arrange for hospice evaluation there.  I will prescribe antibiotics for the patient's wounds that are concerning for early infection I discussed this with family    ____________________________________________   FINAL CLINICAL IMPRESSION(S) / ED DIAGNOSES  Final diagnoses:  Fall, initial encounter  Wound infection        Note:  This document was prepared using Dragon voice recognition software and may include unintentional dictation errors.    Jene Every, MD 06/09/18 [phone]

## 2018-06-09 NOTE — ED Notes (Signed)
Placed pads on bed for safety. Sitting with pt

## 2018-06-09 NOTE — ED Notes (Signed)
Daughter has just arrived,and she will be sitting with the family member.

## 2018-06-09 NOTE — ED Triage Notes (Signed)
Pt from [facility] with dementia and recent falls. Pt with infected abrasions and lacerations noted to left shoulder, left knee, forehead, left elbow. Pt very confused, responds to name only and is constantly removing care equipment.

## 2018-06-09 NOTE — ED Notes (Signed)
Report called to [facility] and given to [place] med tech.

## 2018-06-09 NOTE — ED Notes (Signed)
MD turned monitoring off in room, and states pt does not currently need monitoring and can remain off the monitor.

## 2018-06-09 NOTE — Clinical Social Work Note (Signed)
Clinical Social Work Assessment  Patient Details  Name: Blake Robinson MRN: [id number] Date of Birth: 11/12/1933  Date of referral:  06/09/18               Reason for consult:  Frequent Admissions / ED Visits, Other (Comment Required)(From Home Place)                Permission sought to share information with:  Family Supports Permission granted to share information::  Yes, Verbal Permission Granted  Name::     Rebecca EatonHCPOA [place] [phone] or home phone [phone]  Agency::     Relationship::     Contact Information:     Housing/Transportation Living arrangements for the past 2 months:  Assisted Living Facility Source of Information:  Spouse, Adult Children Patient Interpreter Needed:  None Criminal Activity/Legal Involvement Pertinent to Current Situation/Hospitalization:  No - Comment as needed Significant Relationships:  Adult Children, Church, Spouse, Other Family Members, Friend Lives with:  Facility Resident Do you feel safe going back to the place where you live?  No Need for family participation in patient care:  No (Coment)  Care giving concerns:  Wife and daughter and son and other family members and friends will support this patient with comfort care measures   Social Worker assessment / plan: LCSW introduced myself to family and daughter and pt was unresponsive and sleeping. This worker offered immediate support to wife and daughter. [facility] and rally around patient in the memory care unit and call [facility] but [facility]. LCSW reviewed good coping stratagies for wife and daughter and reviewed self care practices for caregivers. [facility] list and family resource guide for care givers provided. LCSW consulted with Dr Cyril LoosenKinner and patient will return to Home Place and family will determine patient needs but agreed a locked unit would be better for him and comfort care for patient will be administered by  several [facility]  Employment status:  Retired Health and safety inspectornsurance information:  Medicare(Aetna) PT Recommendations:   na                                       Information / Referral to community resources:   Resources to several [facility] facilities [facility] and [facility] [place])  Patient/Family's Response to care:  Good understanding  Patient/Family's Understanding of and Emotional Response to Diagnosis, Current Treatment, and Prognosis:  Good understanding pt to return to home place [facility] and family will attach to Hospice of their choosing  Emotional Assessment Appearance:  Appears older than stated age Attitude/Demeanor/Rapport:  Unresponsive, Unable to Assess Affect (typically observed):  Stable, Unable to Assess Orientation:  Oriented to Self Alcohol / Substance use:  Not Applicable Psych involvement (Current and /or in the community):  No (Comment)  Discharge Needs  Concerns to be addressed:  No discharge needs identified Readmission within the last 30 days:    Current discharge risk:  None Barriers to Discharge:  No Barriers Identified   Cheron SchaumannBandi, Selenne Coggin M, LCSW 06/09/2018, 10:36 AM

## 2018-06-30 DEATH — deceased

## 2019-06-18 IMAGING — CT CT HEAD W/O CM
3 of 4 series · 15 of 47 positions shown, 18 images · non-contrast
Comparison: June 06, 2018 and March 25, 2018

CLINICAL DATA: Several recent falls. Dementia. Altered mental
status

EXAM:
CT HEAD WITHOUT CONTRAST
TECHNIQUE: Contiguous axial images were obtained from the base of the skull
through the vertex without intravenous contrast.

[Series 2: head wo · axial · 0.41mm/px · z∈[-176,-56]mm · 9 of 30 slices shown, 12 images]
[im 3/30  brain]
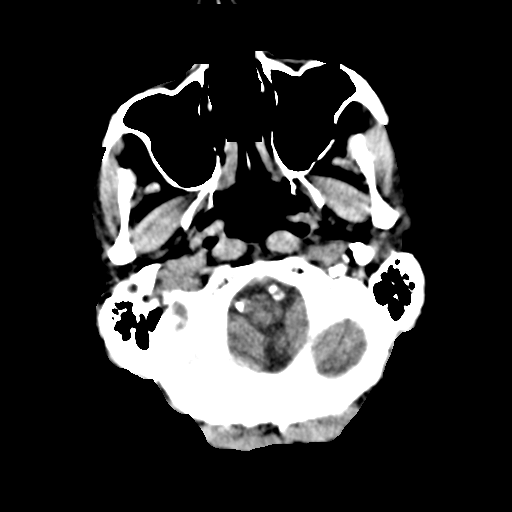
[im 3/30  bone]
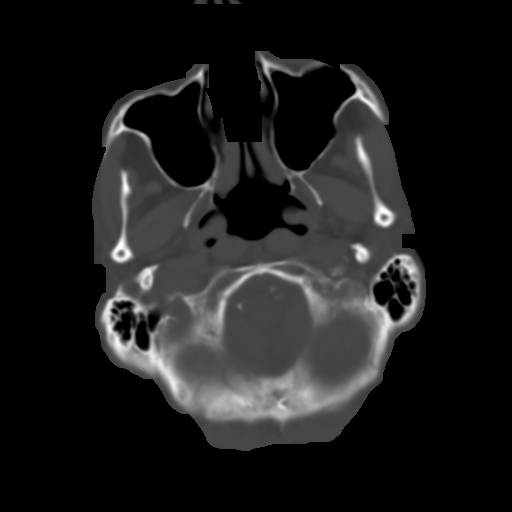
[im 7/30  brain]
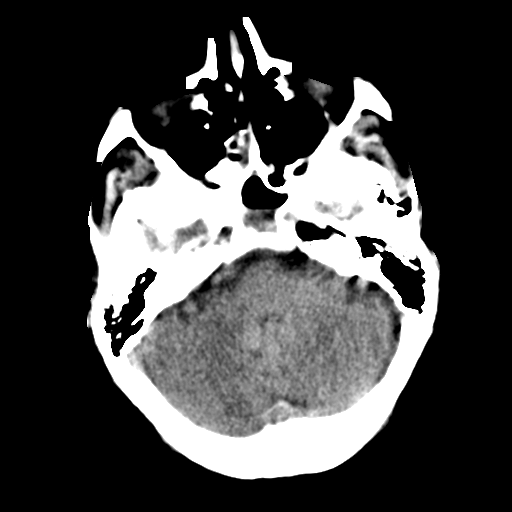
[im 9/30  brain]
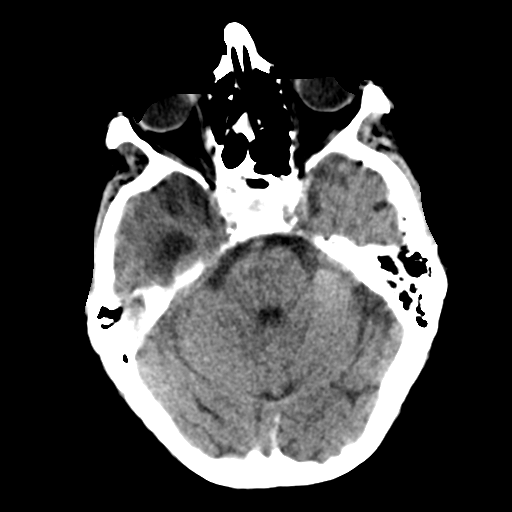
[im 13/30  brain]
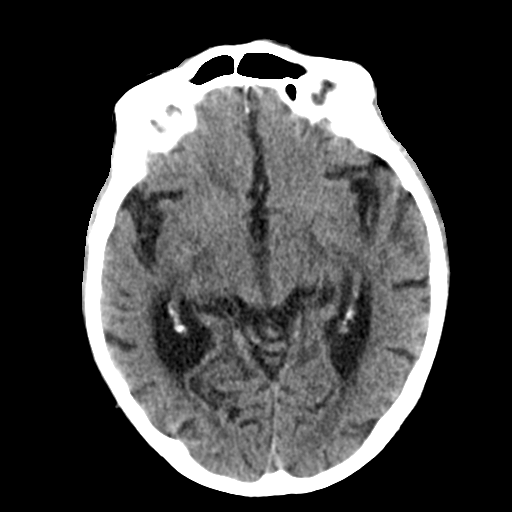
[im 15/30  brain]
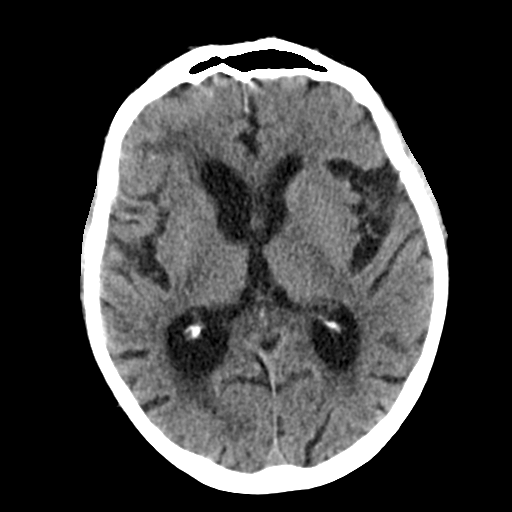
[im 15/30  bone]
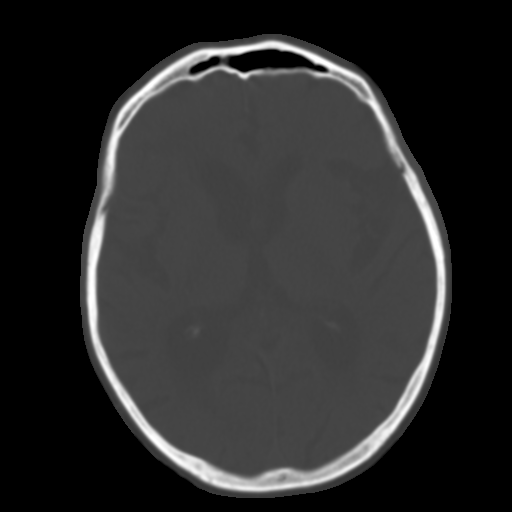
[im 17/30  brain]
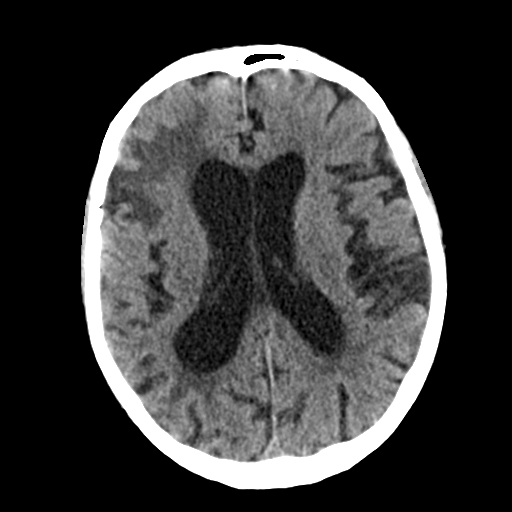
[im 21/30  brain]
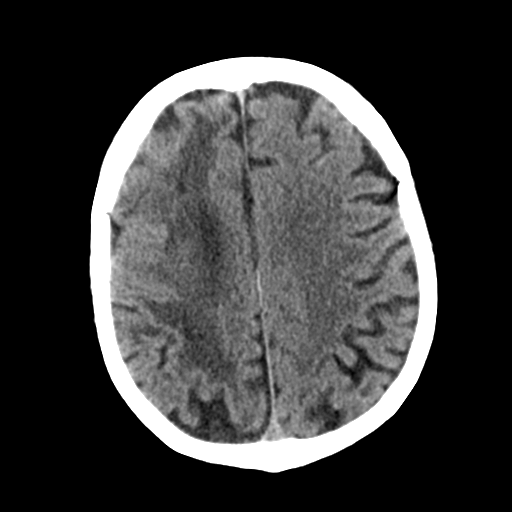
[im 23/30  brain]
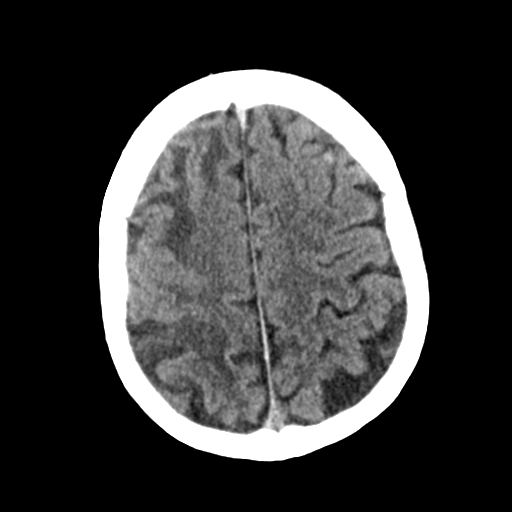
[im 27/30  brain]
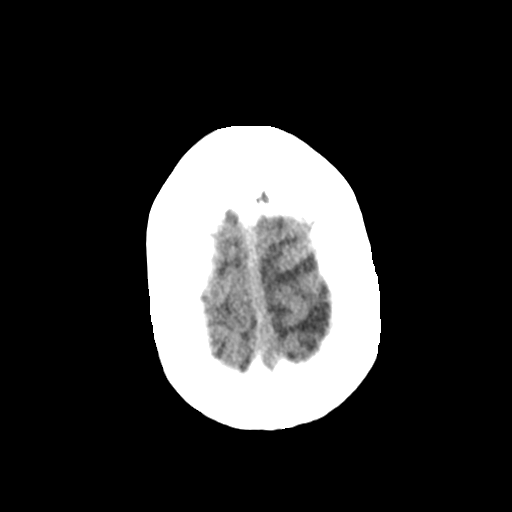
[im 27/30  bone]
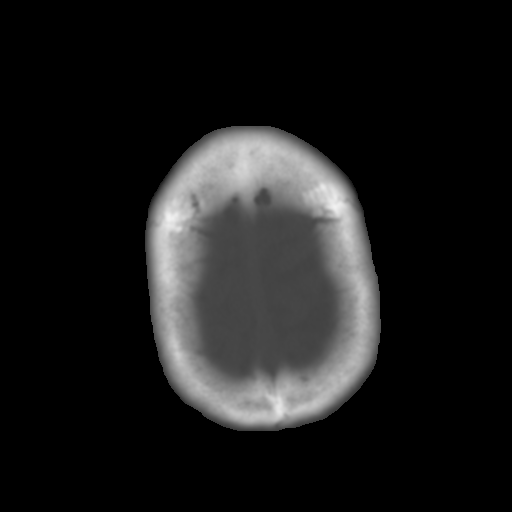

[Series 6: coronal soft tissue · coronal · 0.32mm/px · 3 of 65 slices shown]
[im 22/65  brain]
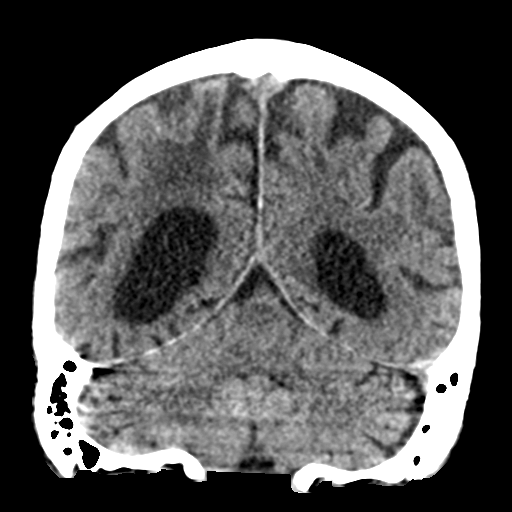
[im 29/65  brain]
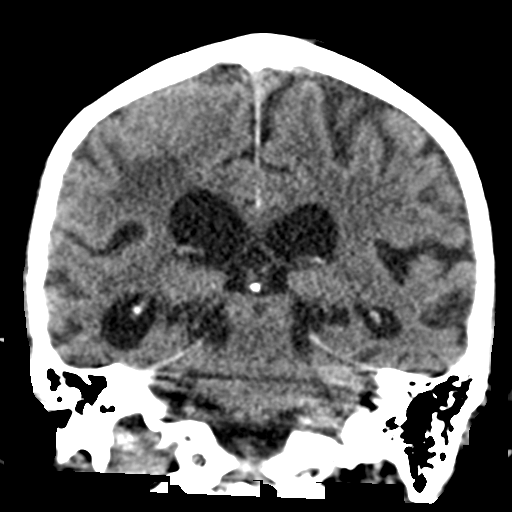
[im 36/65  brain]
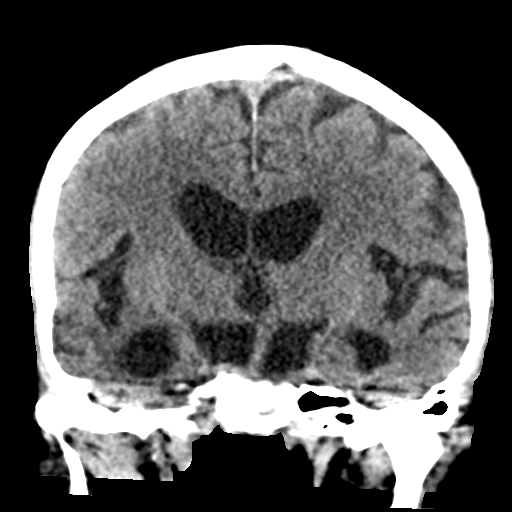

[Series 7: sagittal soft tissue · sagittal · 0.32mm/px · 3 of 53 slices shown]
[im 18/53  brain]
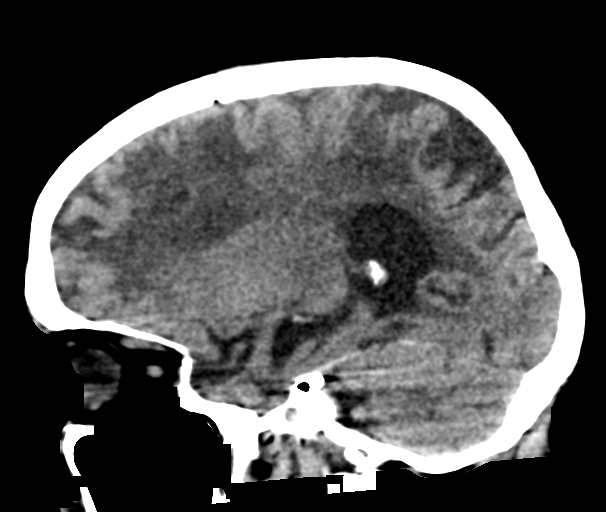
[im 27/53  brain]
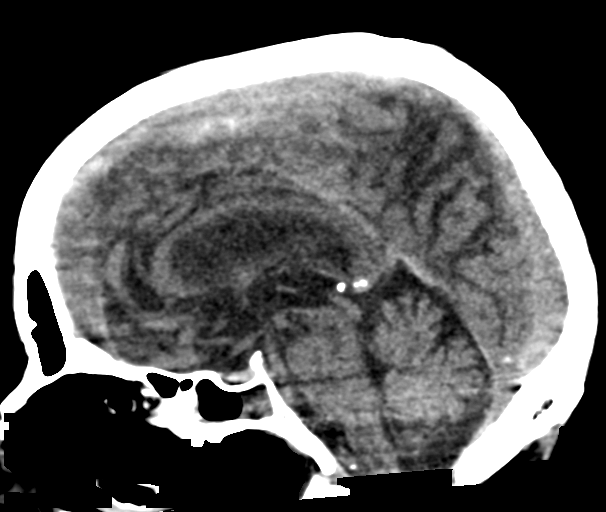
[im 35/53  brain]
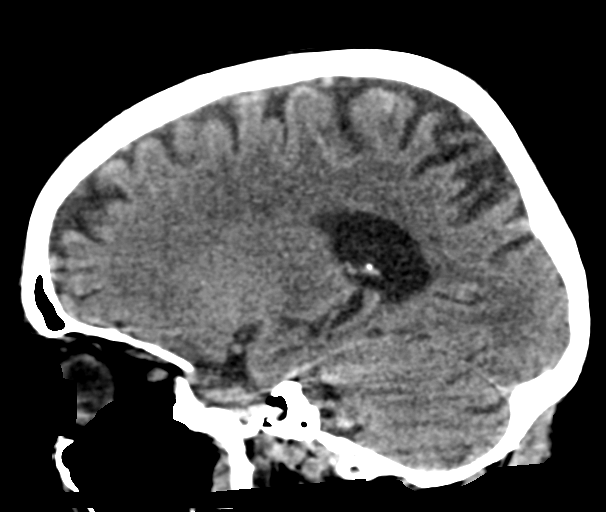

[15 of 47 positions shown; findings below may reference images not displayed]

FINDINGS: Brain: There is moderate diffuse atrophy, stable. There has been
near complete resolution of prior right frontal lobe hemorrhage
compared to prior study from February 2018. No acute hemorrhage is
evident. Prior infarcts in the right frontal and right parietal
lobes remain without change. There is no appreciable mass effect or
midline shift. No subdural or epidural fluid collections are
currently evident. No acute infarct is demonstrable.

Vascular: No hyperdense vessel. There is calcification in each
distal vertebral artery and carotid siphon region.

Skull: The bony calvarium appears intact.

Sinuses/Orbits: There is mucosal thickening in ethmoid air cells
bilaterally. Air-fluid level noted in sphenoid sinus regions orbits
appear symmetric bilaterally.

Other: Mastoid air cells are clear. There is debris in each external
auditory canal.
IMPRESSION: Currently, there is evidence of acute sphenoid sinusitis with
air-fluid level in this area. Other areas of paranasal sinus disease
as noted.

Stable atrophy with prior right frontal and right parietal infarcts.
Near complete resolution of hemorrhage in the right frontal lobe
region, stable. No acute infarct or acute hemorrhage evident. No
midline shift or mass effect. No extra-axial fluid.

Foci of arterial vascular calcification noted. Probable cerumen in
each external auditory canal.

## 2019-06-20 IMAGING — DX DG CHEST 1V PORT
1 series · 2 of 2 positions shown · non-contrast
Comparison: CT chest dated 06/06/2018

CLINICAL DATA: Dementia, recent falls

EXAM:
PORTABLE CHEST 1 VIEW

[Series 1: chest ap · 0.14mm/px · 2 of 2 slices shown]
[im 1/2]
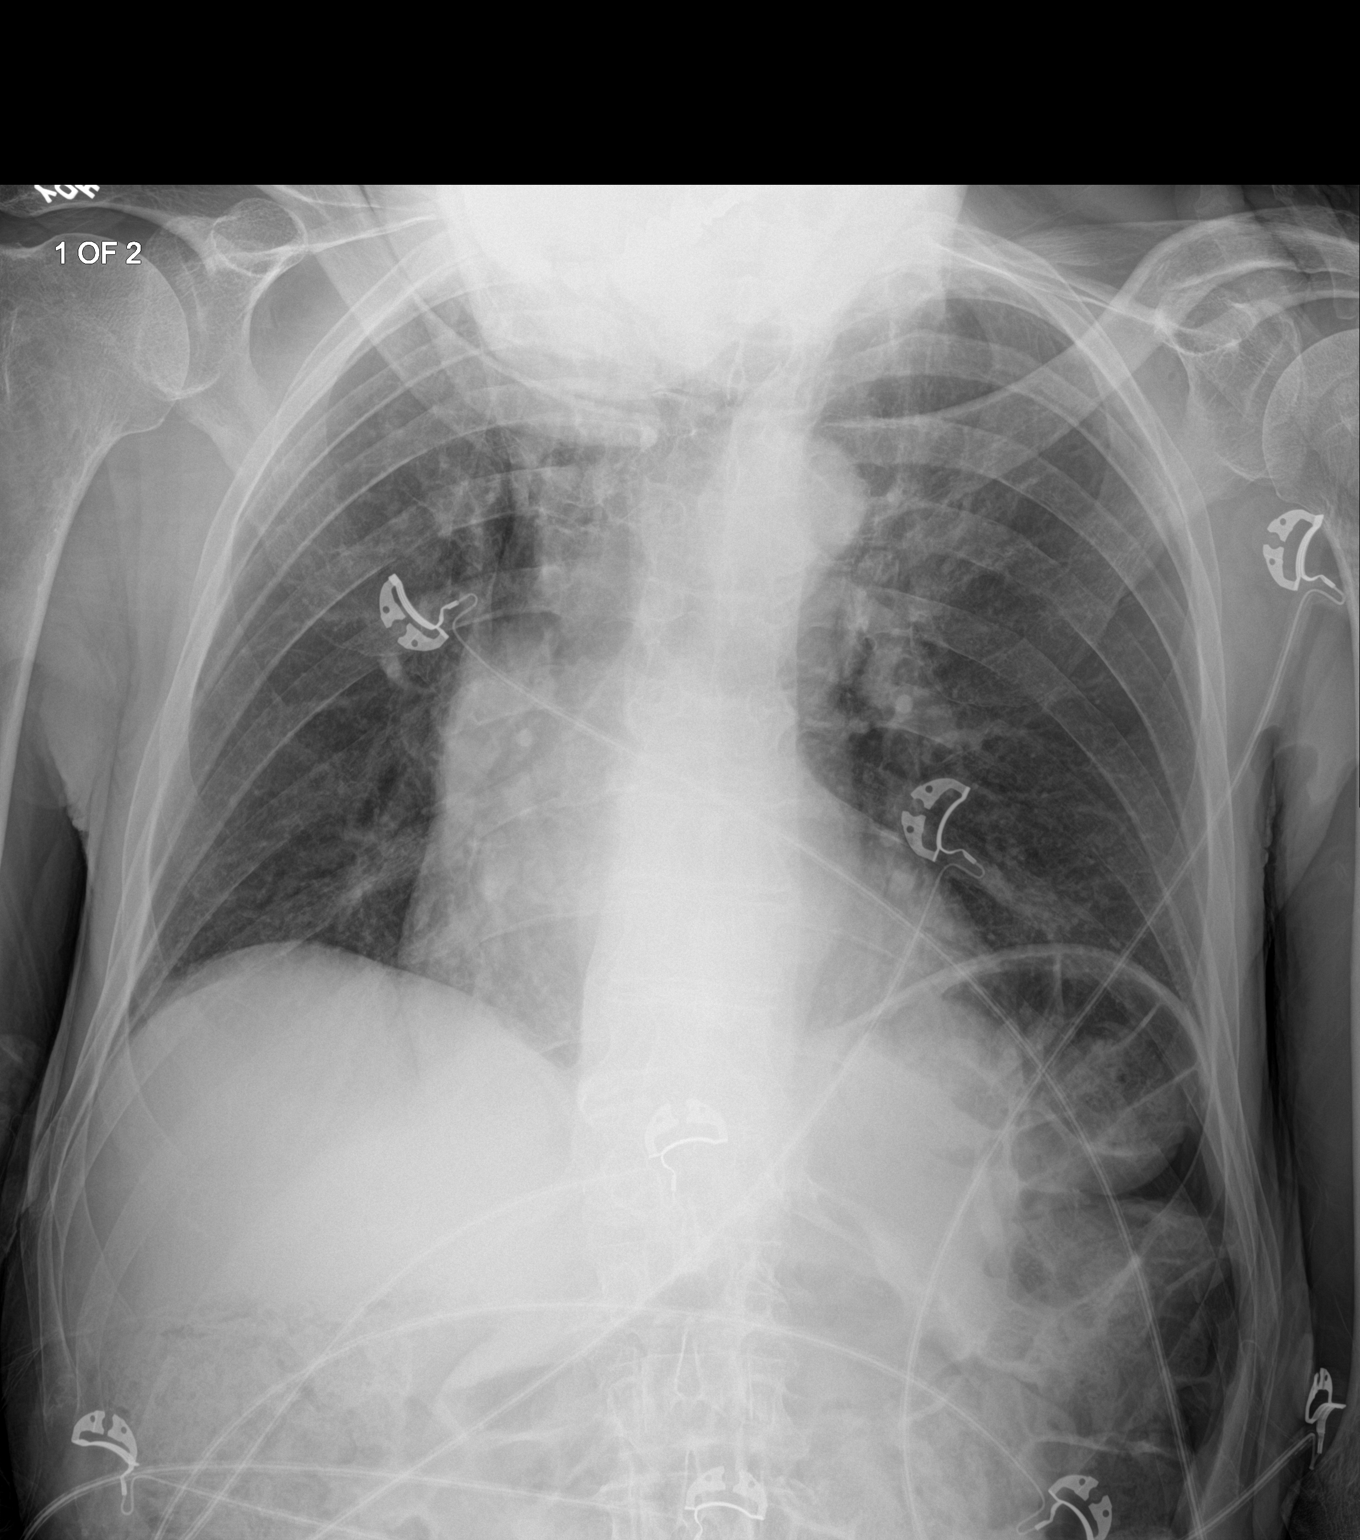
[im 2/2]
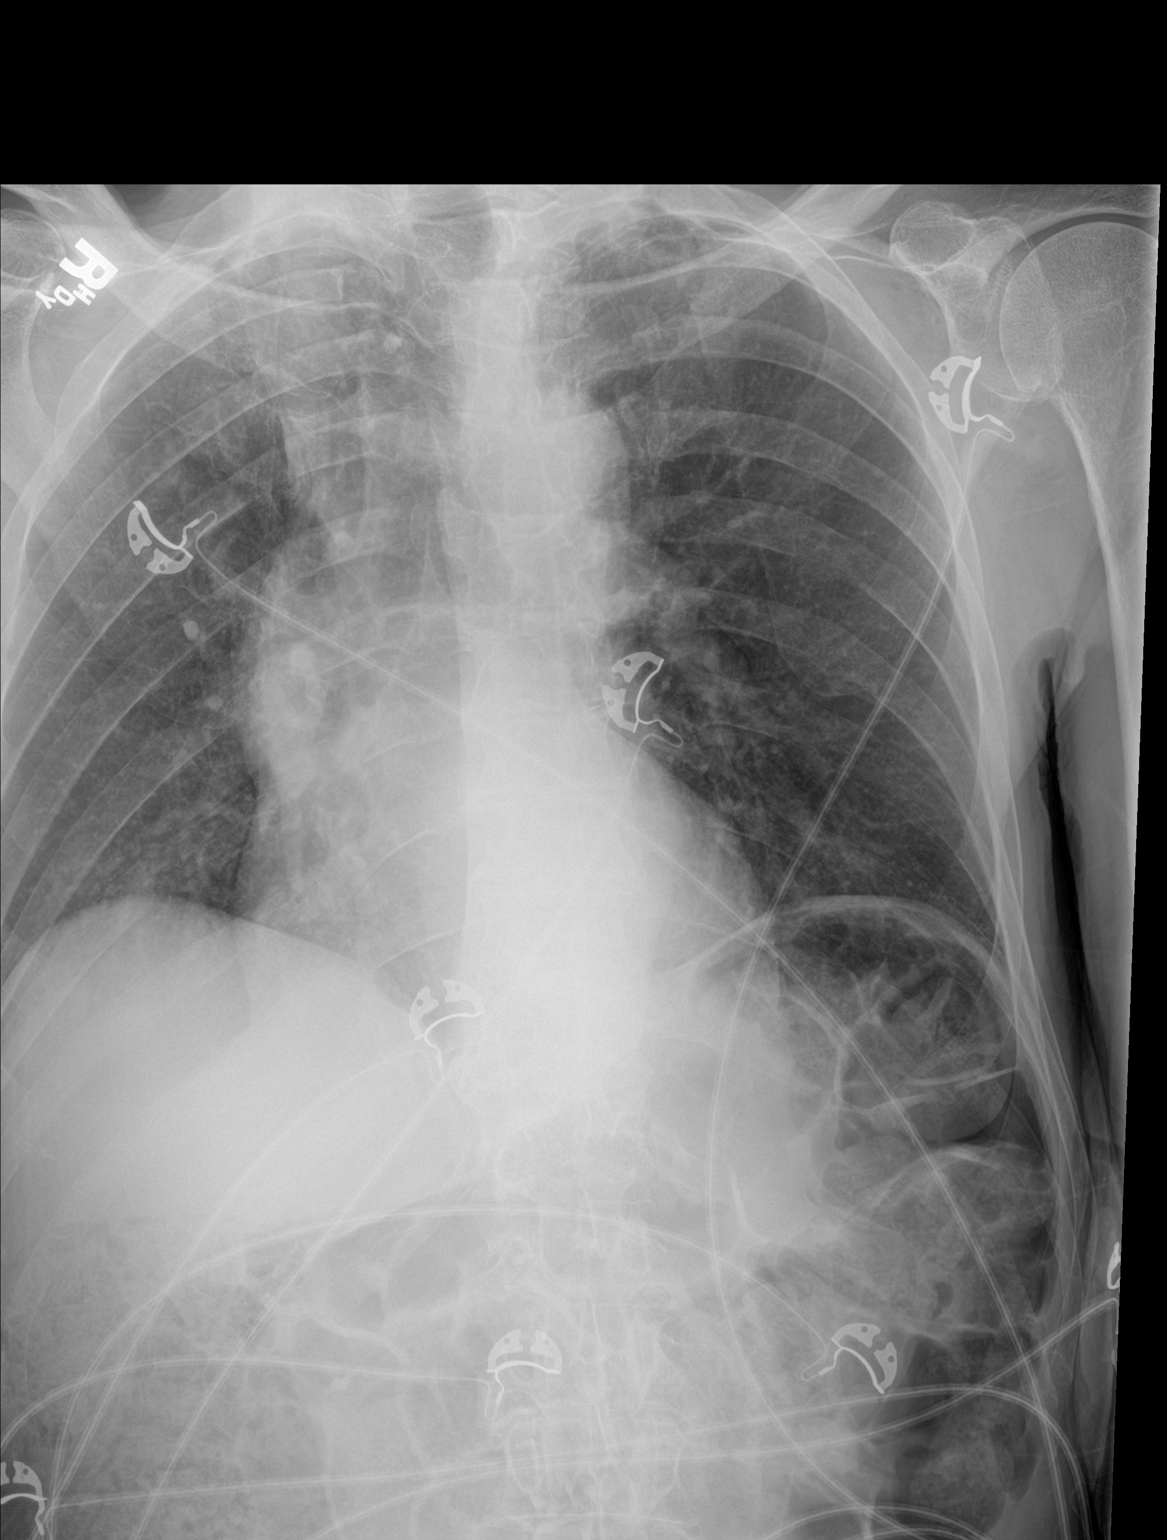

[2 of 2 positions shown; findings below may reference images not displayed]

FINDINGS: Lungs are clear.  No pleural effusion or pneumothorax.

The heart is normal in size.
IMPRESSION: No evidence of acute cardiopulmonary disease.
